# Patient Record
Sex: Female | Born: 1950 | Race: Black or African American | Hispanic: No | Marital: Married | Smoking: Former smoker
Health system: Southern US, Community
[De-identification: ages and names within clinical notes are randomized; demographics above are authoritative.]

## PROBLEM LIST (undated history)

## (undated) DIAGNOSIS — N200 Calculus of kidney: Secondary | ICD-10-CM

## (undated) DIAGNOSIS — I38 Endocarditis, valve unspecified: Secondary | ICD-10-CM

## (undated) DIAGNOSIS — M797 Fibromyalgia: Secondary | ICD-10-CM

## (undated) DIAGNOSIS — I10 Essential (primary) hypertension: Secondary | ICD-10-CM

## (undated) DIAGNOSIS — G4733 Obstructive sleep apnea (adult) (pediatric): Secondary | ICD-10-CM

## (undated) DIAGNOSIS — D860 Sarcoidosis of lung: Secondary | ICD-10-CM

## (undated) DIAGNOSIS — D6800 Von Willebrand disease, unspecified: Secondary | ICD-10-CM

## (undated) DIAGNOSIS — H269 Unspecified cataract: Secondary | ICD-10-CM

## (undated) DIAGNOSIS — H9193 Unspecified hearing loss, bilateral: Secondary | ICD-10-CM

## (undated) DIAGNOSIS — K5792 Diverticulitis of intestine, part unspecified, without perforation or abscess without bleeding: Secondary | ICD-10-CM

## (undated) DIAGNOSIS — Z9889 Other specified postprocedural states: Secondary | ICD-10-CM

## (undated) DIAGNOSIS — H409 Unspecified glaucoma: Secondary | ICD-10-CM

## (undated) DIAGNOSIS — R112 Nausea with vomiting, unspecified: Secondary | ICD-10-CM

## (undated) DIAGNOSIS — K760 Fatty (change of) liver, not elsewhere classified: Secondary | ICD-10-CM

## (undated) DIAGNOSIS — E119 Type 2 diabetes mellitus without complications: Secondary | ICD-10-CM

## (undated) HISTORY — PX: HERNIA REPAIR: SHX51

## (undated) HISTORY — PX: BACK SURGERY: SHX140

## (undated) HISTORY — PX: HAND SURGERY: SHX662

## (undated) HISTORY — PX: LUNG BIOPSY: SHX232

## (undated) HISTORY — PX: TIBIA FRACTURE SURGERY: SHX806

## (undated) HISTORY — PX: APPENDECTOMY: SHX54

## (undated) HISTORY — PX: ABDOMINAL HYSTERECTOMY: SHX81

## (undated) HISTORY — PX: CHOLECYSTECTOMY: SHX55

---

## 2012-09-07 ENCOUNTER — Encounter (HOSPITAL_BASED_OUTPATIENT_CLINIC_OR_DEPARTMENT_OTHER): Payer: Self-pay

## 2012-09-07 ENCOUNTER — Emergency Department (HOSPITAL_BASED_OUTPATIENT_CLINIC_OR_DEPARTMENT_OTHER): Payer: Medicare Other

## 2012-09-07 ENCOUNTER — Emergency Department (HOSPITAL_BASED_OUTPATIENT_CLINIC_OR_DEPARTMENT_OTHER)
Admission: EM | Admit: 2012-09-07 | Discharge: 2012-09-07 | Disposition: A | Discharge status: Home / Self Care | Payer: Medicare Other | Attending: Emergency Medicine | Admitting: Emergency Medicine

## 2012-09-07 DIAGNOSIS — H409 Unspecified glaucoma: Secondary | ICD-10-CM | POA: Insufficient documentation

## 2012-09-07 DIAGNOSIS — Z8739 Personal history of other diseases of the musculoskeletal system and connective tissue: Secondary | ICD-10-CM | POA: Insufficient documentation

## 2012-09-07 DIAGNOSIS — Z87442 Personal history of urinary calculi: Secondary | ICD-10-CM | POA: Insufficient documentation

## 2012-09-07 DIAGNOSIS — Z8709 Personal history of other diseases of the respiratory system: Secondary | ICD-10-CM | POA: Insufficient documentation

## 2012-09-07 DIAGNOSIS — E119 Type 2 diabetes mellitus without complications: Secondary | ICD-10-CM | POA: Insufficient documentation

## 2012-09-07 DIAGNOSIS — Z9089 Acquired absence of other organs: Secondary | ICD-10-CM | POA: Insufficient documentation

## 2012-09-07 DIAGNOSIS — R1032 Left lower quadrant pain: Secondary | ICD-10-CM | POA: Insufficient documentation

## 2012-09-07 DIAGNOSIS — I10 Essential (primary) hypertension: Secondary | ICD-10-CM | POA: Insufficient documentation

## 2012-09-07 DIAGNOSIS — IMO0002 Reserved for concepts with insufficient information to code with codable children: Secondary | ICD-10-CM | POA: Insufficient documentation

## 2012-09-07 DIAGNOSIS — Z8619 Personal history of other infectious and parasitic diseases: Secondary | ICD-10-CM | POA: Insufficient documentation

## 2012-09-07 DIAGNOSIS — Z8719 Personal history of other diseases of the digestive system: Secondary | ICD-10-CM | POA: Insufficient documentation

## 2012-09-07 DIAGNOSIS — Z880 Allergy status to penicillin: Secondary | ICD-10-CM | POA: Insufficient documentation

## 2012-09-07 DIAGNOSIS — Z90710 Acquired absence of both cervix and uterus: Secondary | ICD-10-CM | POA: Insufficient documentation

## 2012-09-07 DIAGNOSIS — Z8679 Personal history of other diseases of the circulatory system: Secondary | ICD-10-CM | POA: Insufficient documentation

## 2012-09-07 DIAGNOSIS — Z872 Personal history of diseases of the skin and subcutaneous tissue: Secondary | ICD-10-CM | POA: Insufficient documentation

## 2012-09-07 DIAGNOSIS — R1110 Vomiting, unspecified: Secondary | ICD-10-CM | POA: Insufficient documentation

## 2012-09-07 DIAGNOSIS — Z79899 Other long term (current) drug therapy: Secondary | ICD-10-CM | POA: Insufficient documentation

## 2012-09-07 DIAGNOSIS — Z8669 Personal history of other diseases of the nervous system and sense organs: Secondary | ICD-10-CM | POA: Insufficient documentation

## 2012-09-07 DIAGNOSIS — R109 Unspecified abdominal pain: Secondary | ICD-10-CM

## 2012-09-07 HISTORY — DX: Fibromyalgia: M79.7

## 2012-09-07 HISTORY — DX: Diverticulitis of intestine, part unspecified, without perforation or abscess without bleeding: K57.92

## 2012-09-07 HISTORY — DX: Unspecified glaucoma: H40.9

## 2012-09-07 HISTORY — DX: Endocarditis, valve unspecified: I38

## 2012-09-07 HISTORY — DX: Type 2 diabetes mellitus without complications: E11.9

## 2012-09-07 HISTORY — DX: Unspecified cataract: H26.9

## 2012-09-07 HISTORY — DX: Essential (primary) hypertension: I10

## 2012-09-07 HISTORY — DX: Obstructive sleep apnea (adult) (pediatric): G47.33

## 2012-09-07 HISTORY — DX: Von Willebrand disease, unspecified: D68.00

## 2012-09-07 HISTORY — DX: Calculus of kidney: N20.0

## 2012-09-07 HISTORY — DX: Sarcoidosis of lung: D86.0

## 2012-09-07 HISTORY — DX: Von Willebrand's disease: D68.0

## 2012-09-07 LAB — CBC WITH DIFFERENTIAL/PLATELET
Basophils Relative: 0 % (ref 0–1)
Hemoglobin: 14 g/dL (ref 12.0–15.0)
Lymphocytes Relative: 13 % (ref 12–46)
MCHC: 33.5 g/dL (ref 30.0–36.0)
Monocytes Relative: 14 % — ABNORMAL HIGH (ref 3–12)
Neutro Abs: 3.4 10*3/uL (ref 1.7–7.7)
Neutrophils Relative %: 72 % (ref 43–77)
RBC: 5.07 MIL/uL (ref 3.87–5.11)
WBC: 4.7 10*3/uL (ref 4.0–10.5)

## 2012-09-07 LAB — COMPREHENSIVE METABOLIC PANEL
ALT: 13 U/L (ref 0–35)
Albumin: 3.4 g/dL — ABNORMAL LOW (ref 3.5–5.2)
Alkaline Phosphatase: 94 U/L (ref 39–117)
Glucose, Bld: 92 mg/dL (ref 70–99)
Potassium: 3.6 mEq/L (ref 3.5–5.1)
Sodium: 141 mEq/L (ref 135–145)
Total Protein: 7.1 g/dL (ref 6.0–8.3)

## 2012-09-07 LAB — URINE MICROSCOPIC-ADD ON

## 2012-09-07 LAB — URINALYSIS, ROUTINE W REFLEX MICROSCOPIC
Hgb urine dipstick: NEGATIVE
Nitrite: NEGATIVE
Specific Gravity, Urine: 1.02 (ref 1.005–1.030)
Urobilinogen, UA: 1 mg/dL (ref 0.0–1.0)

## 2012-09-07 MED ORDER — MORPHINE SULFATE 4 MG/ML IJ SOLN
4.0000 mg | Freq: Once | INTRAMUSCULAR | Status: AC
  Administered 2012-09-07: 4 mg via INTRAVENOUS
  Filled 2012-09-07: qty 1

## 2012-09-07 MED ORDER — ONDANSETRON HCL 4 MG PO TABS: 4.0000 mg | ORAL_TABLET | Freq: Three times a day (TID) | ORAL | Status: DC | PRN

## 2012-09-07 MED ORDER — IOHEXOL 300 MG/ML  SOLN
50.0000 mL | Freq: Once | INTRAMUSCULAR | Status: AC | PRN
  Administered 2012-09-07: 50 mL via ORAL

## 2012-09-07 MED ORDER — ONDANSETRON HCL 4 MG/2ML IJ SOLN
4.0000 mg | Freq: Once | INTRAMUSCULAR | Status: AC
  Administered 2012-09-07: 4 mg via INTRAVENOUS
  Filled 2012-09-07: qty 2

## 2012-09-07 MED ORDER — SODIUM CHLORIDE 0.9 % IV BOLUS (SEPSIS)
1000.0000 mL | Freq: Once | INTRAVENOUS | Status: AC
  Administered 2012-09-07: 1000 mL via INTRAVENOUS

## 2012-09-07 NOTE — ED Provider Notes (Signed)
History     CSN: [id number]  Arrival date & time 09/07/12  1226   First MD Initiated Contact with Patient 09/07/12 1227      Chief Complaint  Patient presents with  . Abdominal Pain    (Consider location/radiation/quality/duration/timing/severity/associated sxs/prior treatment) Patient is a 62 y.o. female presenting with abdominal pain.  Abdominal Pain Associated symptoms include abdominal pain.   Pt with history of multiple medical problems reports moderate to severe aching LLQ abdominal pain off and on for the last 2 days, worse just before BM, one episode of vomiting yesterday. No blood in vomit or stool. No constipation or dysuria. Has history of diverticulosis.   Past Medical History  Diagnosis Date  . Pulmonary sarcoidosis   . Fibromyalgia   . Heart valve disorder   . Glaucoma   . Hypertension   . Von Willebrand disease   . Diabetes mellitus without complication   . Cataracts, bilateral   . OSA (obstructive sleep apnea)   . Diverticulitis   . Kidney stone     Past Surgical History  Procedure Laterality Date  . Cholecystectomy    . Appendectomy    . Abdominal hysterectomy    . Hernia repair    . Tibia fracture surgery    . Hand surgery      No family history on file.  History  Substance Use Topics  . Smoking status: Never Smoker   . Smokeless tobacco: Not on file  . Alcohol Use: No    OB History   Grav Para Term Preterm Abortions TAB SAB Ect Mult Living                  Review of Systems  Gastrointestinal: Positive for abdominal pain.   All other systems reviewed and are negative except as noted in HPI.   Allergies  Cymbalta; Ivp dye; Lyrica; Nickel; and Penicillins  Home Medications   Current Outpatient Rx  Name  Route  Sig  Dispense  Refill  . brimonidine (ALPHAGAN) 0.15 % ophthalmic solution      1 drop 2 (two) times daily.         . brinzolamide (AZOPT) 1 % ophthalmic suspension      1 drop 2 (two) times daily.         .  Cholecalciferol (VITAMIN D PO)   Oral   Take by mouth once a week.         . DULoxetine HCl (CYMBALTA PO)   Oral   Take by mouth.         Marland Kitchen FLUoxetine (PROZAC) 20 MG tablet   Oral   Take 60 mg by mouth daily.         Marland Kitchen OMEPRAZOLE PO   Oral   Take by mouth.         . oxyCODONE (OXYCONTIN) 20 MG 12 hr tablet   Oral   Take 20 mg by mouth every 12 (twelve) hours.         Marland Kitchen PENICILLIN V POTASSIUM PO   Oral   Take by mouth.         . predniSONE (DELTASONE) 2.5 MG tablet   Oral   Take 2.5 mg by mouth every other day.         . Pregabalin (LYRICA PO)   Oral   Take by mouth.         Marland Kitchen tiZANidine (ZANAFLEX) 2 MG tablet   Oral   Take 2 mg by mouth every  6 (six) hours as needed.         . verapamil (COVERA HS) 180 MG (CO) 24 hr tablet   Oral   Take 180 mg by mouth at bedtime.         Marland Kitchen zolpidem (AMBIEN) 5 MG tablet   Oral   Take 2.5 mg by mouth at bedtime as needed for sleep.           There were no vitals taken for this visit.  Physical Exam  Nursing note and vitals reviewed. Constitutional: She is oriented to person, place, and time. She appears well-developed and well-nourished.  HENT:  Head: Normocephalic and atraumatic.  Eyes: EOM are normal. Pupils are equal, round, and reactive to light.  Neck: Normal range of motion. Neck supple.  Cardiovascular: Normal rate, normal heart sounds and intact distal pulses.   Pulmonary/Chest: Effort normal and breath sounds normal.  Abdominal: Bowel sounds are normal. She exhibits no distension. There is tenderness (LLQ). There is no rebound and no guarding.  Musculoskeletal: Normal range of motion. She exhibits no edema and no tenderness.  Neurological: She is alert and oriented to person, place, and time. She has normal strength. No cranial nerve deficit or sensory deficit.  Skin: Skin is warm and dry. No rash noted.  Psychiatric: She has a normal mood and affect.    ED Course  Procedures (including  critical care time)  Labs Reviewed  CBC WITH DIFFERENTIAL - Abnormal; Notable for the following:    Lymphs Abs 0.6 (*)    Monocytes Relative 14 (*)    All other components within normal limits  URINALYSIS, ROUTINE W REFLEX MICROSCOPIC - Abnormal; Notable for the following:    Leukocytes, UA SMALL (*)    All other components within normal limits  COMPREHENSIVE METABOLIC PANEL - Abnormal; Notable for the following:    Albumin 3.4 (*)    GFR calc non Af Amer 77 (*)    GFR calc Af Amer 89 (*)    All other components within normal limits  URINE MICROSCOPIC-ADD ON - Abnormal; Notable for the following:    Casts HYALINE CASTS (*)    All other components within normal limits   Ct Abdomen Pelvis Wo Contrast  09/07/2012   *RADIOLOGY REPORT*  Clinical Data: Left lower quadrant pain.  History of diverticulitis.  CT ABDOMEN AND PELVIS WITHOUT CONTRAST  Technique:  Multidetector CT imaging of the abdomen and pelvis was performed following the standard protocol without intravenous contrast.  Comparison: None.  Findings: There is marked elevation of the right hemidiaphragm relative to the left.  Lung bases are clear.  No pleural or pericardial effusion.  Calcific coronary artery disease is identified.  The patient is status post cholecystectomy.  The liver, spleen, adrenal glands and pancreas appear normal.  The kidneys are unremarkable.  The patient is status post hysterectomy. The patient has extensive diverticulosis but no evidence of diverticulitis is seen. Several small lymph nodes are identified in the upper retroperitoneum measuring up to 1.1 cm in diameter.  There is no fluid collection. No focal bony abnormality.  IMPRESSION:  1.  No acute finding. 2.  Extensive diverticulosis without evidence of diverticulitis. 3.  Status post cholecystectomy. 4.  Small upper retroperitoneal lymph nodes cannot be definitively characterized.  They are likely reactive but repeat CT abdomen and pelvis with contrast in 4-6  months to ensure stability or resolution is recommended.   Original Report Authenticated By: Holley Dexter, M.D.     No  diagnosis found.    MDM  Labs and imaging unremarkable. No definite cause of abdominal pain. Pt's abdomen remains benign. Advised to take home pain meds if needed. Nausea meds PRN and follow up with PCP if not improving.         Lucee Brissett B. Bernette Mayers, MD 09/07/12 1530

## 2012-09-07 NOTE — ED Notes (Signed)
MD at bedside. 

## 2012-09-07 NOTE — ED Notes (Signed)
C/o LLQ pain today RLQ pain x 2 days

## 2012-09-07 NOTE — ED Notes (Signed)
Patient ambulates to restroom without difficulty and with no assistance.

## 2013-01-08 ENCOUNTER — Encounter (HOSPITAL_COMMUNITY): Payer: Self-pay | Admitting: Pharmacy Technician

## 2013-01-14 ENCOUNTER — Ambulatory Visit (HOSPITAL_COMMUNITY): Admission: RE | Admit: 2013-01-14 | Payer: Medicare Other | Source: Ambulatory Visit | Admitting: Oral Surgery

## 2013-01-14 ENCOUNTER — Encounter (HOSPITAL_COMMUNITY): Admission: RE | Payer: Self-pay | Source: Ambulatory Visit

## 2013-01-14 SURGERY — DENTAL RESTORATION/EXTRACTIONS
Anesthesia: General | Site: Mouth

## 2013-02-08 ENCOUNTER — Encounter (HOSPITAL_COMMUNITY): Payer: Self-pay | Admitting: Pharmacy Technician

## 2013-02-08 NOTE — Consult Note (Signed)
  This is a 62 y/o female who presents with Sarcoidosis, type II DM, Fibromyalgia, and hypertension.  The treatment plan is to remove the non restorable teeth under general anesthesia.  The patient is nervous and not a good medical candidate for sedation in the office

## 2013-02-12 ENCOUNTER — Encounter (HOSPITAL_COMMUNITY)
Admission: RE | Admit: 2013-02-12 | Discharge: 2013-02-12 | Disposition: A | Discharge status: Home / Self Care | Payer: Medicare Other | Source: Ambulatory Visit | Attending: Oral Surgery | Admitting: Oral Surgery

## 2013-02-12 ENCOUNTER — Encounter (HOSPITAL_COMMUNITY): Payer: Self-pay

## 2013-02-12 ENCOUNTER — Encounter (HOSPITAL_COMMUNITY): Payer: Self-pay | Admitting: Oral Surgery

## 2013-02-12 ENCOUNTER — Ambulatory Visit (HOSPITAL_COMMUNITY)
Admission: RE | Admit: 2013-02-12 | Discharge: 2013-02-12 | Disposition: A | Discharge status: Home / Self Care | Payer: Medicare Other | Source: Ambulatory Visit | Attending: Anesthesiology | Admitting: Anesthesiology

## 2013-02-12 DIAGNOSIS — Z01818 Encounter for other preprocedural examination: Secondary | ICD-10-CM | POA: Insufficient documentation

## 2013-02-12 DIAGNOSIS — Z01811 Encounter for preprocedural respiratory examination: Secondary | ICD-10-CM | POA: Insufficient documentation

## 2013-02-12 DIAGNOSIS — Z01812 Encounter for preprocedural laboratory examination: Secondary | ICD-10-CM | POA: Insufficient documentation

## 2013-02-12 DIAGNOSIS — Z01810 Encounter for preprocedural cardiovascular examination: Secondary | ICD-10-CM | POA: Insufficient documentation

## 2013-02-12 HISTORY — DX: Nausea with vomiting, unspecified: R11.2

## 2013-02-12 HISTORY — DX: Other specified postprocedural states: Z98.890

## 2013-02-12 LAB — COMPREHENSIVE METABOLIC PANEL
ALT: 12 U/L (ref 0–35)
Albumin: 3.4 g/dL — ABNORMAL LOW (ref 3.5–5.2)
Alkaline Phosphatase: 86 U/L (ref 39–117)
Potassium: 3.8 mEq/L (ref 3.5–5.1)
Sodium: 140 mEq/L (ref 135–145)
Total Protein: 7.1 g/dL (ref 6.0–8.3)

## 2013-02-12 LAB — CBC
HCT: 41.7 % (ref 36.0–46.0)
Hemoglobin: 13.6 g/dL (ref 12.0–15.0)
MCHC: 32.6 g/dL (ref 30.0–36.0)
Platelets: 175 10*3/uL (ref 150–400)
RDW: 14 % (ref 11.5–15.5)
WBC: 4.3 10*3/uL (ref 4.0–10.5)

## 2013-02-12 LAB — NO BLOOD PRODUCTS

## 2013-02-12 NOTE — Progress Notes (Signed)
Requested records from [facility] (last OV, EKG, Stress (if ava), Echo (if ava), [facility] (last OV), and [facility] (last OV)

## 2013-02-12 NOTE — Progress Notes (Signed)
Spoke to office and requested orders be placed in Epic

## 2013-02-12 NOTE — Pre-Procedure Instructions (Signed)
Deanna Allison  02/12/2013   Your procedure is scheduled on:  November 13  Report to [facility] Entrance "A" [address] at [employer] AM.  Call this number if you have problems the morning of surgery: [phone]   Remember:   Do not eat food or drink liquids after midnight.   Take these medicines the morning of surgery with A SIP OF WATER: Eye drops, Omeprazole, Oxycodone (if needed), Prednisone, Verapamil, Fluoxetine,     STOP Aspirin, Aleve, Naproxen, Advil, Ibuprofen, Vitamin, Herbs, or Supplements starting today   Do not wear jewelry, make-up or nail polish.  Do not wear lotions, powders, or perfumes. You may wear deodorant.  Do not shave 48 hours prior to surgery. Men may shave face and neck.  Do not bring valuables to the hospital.  [facility] is not responsible                  for any belongings or valuables.               Contacts, dentures or bridgework may not be worn into surgery.  Leave suitcase in the car. After surgery it may be brought to your room.  For patients admitted to the hospital, discharge time is determined by your                treatment team.               Patients discharged the day of surgery will not be allowed to drive  home.  Name and phone number of your driver: Family/ Friend  Special Instructions: Shower using CHG 2 nights before surgery and the night before surgery.  If you shower the day of surgery use CHG.  Use special wash - you have one bottle of CHG for all showers.  You should use approximately 1/3 of the bottle for each shower.   Please read over the following fact sheets that you were given: Pain Booklet, Coughing and Deep Breathing and Surgical Site Infection Prevention

## 2013-02-15 ENCOUNTER — Encounter (HOSPITAL_COMMUNITY): Payer: Self-pay

## 2013-02-15 NOTE — Progress Notes (Addendum)
Anesthesia Chart Review:  Patient is a 62 year old female scheduled for dental extractions by Dr. Hyacinth Meeker on 02/18/13.  History includes former smoker, obesity, post-operative N/V, pulmonary Sarcoidosis, Von Willebrands disease, fibromyalgia, DM2, HTN, OSA on autoPAP, diverticulitis, glaucoma, cataracts, nephrolithiasis, heart valve disorder (see echo below), Jehovah's Witness.  Hematologist is Dr. Silvestre Moment who saw her in July 2014.  Depending on her labs, he was going to decide about clearance recommendations.  I have spoken with Dr. Hyacinth Meeker and staff at Dr. Erick Colace office.  Dr. Gypsy Lore will review 02/12/2013 labs and then decide if additional labs are needed prior to surgery.  I've ask that he fax any additional labs done and provide clearance recommendations as felt appropriate.  Pulmonologist is Dr. Dr. Kenney Houseman with [facility], last visit 02/04/13.  His notes indicate that he was aware of plans for dental surgery.    Cardiologist is Dr. Rhona Leavens with [facility], last visit 08/31/12 with echo and yearly follow-up recommended.  Echo on 09/22/12 showed normal LV cavity size, mild concentric LVH, normal wall motion and systolic function, visual EF 60-65%, severely dilated LA cavity, mildly dilated RA, mild aortic annulus calcification with trace AR, no AS, mild calcification of the mitral annulus with mild to moderate MR, no MS, normal diastolic pattern, mild to moderate TR, mild pulmonary hypertension, normal diastolic flow pattern, mild pulmonic regurgitation, normal aortic foot.  Nuclear stress test on 03/13/10 showed no evidence of ischemia, LVEF 72%.  EKG on 02/12/13 showed NSR with occasional PACs, non-specific T wave abnormality.   CXR on 02/12/13 showed: Elevated right hemidiaphragm. No edema or consolidation. Soft tissue fullness in the aortopulmonary window region. This finding could indicate a degree of adenopathy. Given this finding and the  history  of sarcoidosis, correlation with contrast enhanced chest CT may be advisable to further assess.  Report faxed to Dr. Ysidro Evert with confirmation.  Preoperative labs noted.  CBC WNL. PT/PTT were not done, so I will order them for the day of surgery since she has a known bleeding disorder. She has requested that no blood products be transfused.  I'll follow-up once I receive additional input from hematology.    Velna Ochs [facility] Short Stay Center/Anesthesiology Phone [phone] 02/15/2013 2:40 PM  Addendum: 02/17/2013 1:50 PM Hematology clearance re-requested earlier this morning and has not been received; however, it's now noted that surgery has now been rescheduled for 03/18/2013.

## 2013-02-16 NOTE — Progress Notes (Signed)
Spoke with [facility] and they will refer message to Dr Gypsy Lore re: need for hematology clearance.  Phone number for surgical short stay and Shonna Chock given to office and also surgical short stay fax number.

## 2013-03-11 ENCOUNTER — Encounter (HOSPITAL_COMMUNITY)
Admission: RE | Admit: 2013-03-11 | Discharge: 2013-03-11 | Disposition: A | Discharge status: Home / Self Care | Payer: Medicare Other | Source: Ambulatory Visit | Attending: Oral Surgery | Admitting: Oral Surgery

## 2013-03-11 ENCOUNTER — Encounter (HOSPITAL_COMMUNITY): Payer: Self-pay

## 2013-03-11 DIAGNOSIS — Z01818 Encounter for other preprocedural examination: Secondary | ICD-10-CM | POA: Insufficient documentation

## 2013-03-11 DIAGNOSIS — Z01812 Encounter for preprocedural laboratory examination: Secondary | ICD-10-CM | POA: Insufficient documentation

## 2013-03-11 HISTORY — DX: Unspecified hearing loss, bilateral: H91.93

## 2013-03-11 LAB — CBC
HCT: 41.8 % (ref 36.0–46.0)
Hemoglobin: 13.9 g/dL (ref 12.0–15.0)
MCH: 27.8 pg (ref 26.0–34.0)
MCHC: 33.3 g/dL (ref 30.0–36.0)
Platelets: 184 10*3/uL (ref 150–400)

## 2013-03-11 LAB — BASIC METABOLIC PANEL
BUN: 17 mg/dL (ref 6–23)
Calcium: 9.2 mg/dL (ref 8.4–10.5)
Creatinine, Ser: 0.85 mg/dL (ref 0.50–1.10)
GFR calc non Af Amer: 71 mL/min — ABNORMAL LOW (ref >90)
Glucose, Bld: 107 mg/dL — ABNORMAL HIGH (ref 70–99)
Potassium: 4 mEq/L (ref 3.5–5.1)

## 2013-03-11 LAB — APTT: aPTT: 30 seconds (ref 24–37)

## 2013-03-11 NOTE — Consult Note (Signed)
  This is a 62 y/o female who presents with multiple non restorable teeth. Her medical history includes Von Willabrands disease, sleep apnea and hypertension.  The treatment plan includes extractions and insertion of an immediate denture.

## 2013-03-11 NOTE — Progress Notes (Addendum)
Per pt labs drawn 12-3 at [facility].  Spoke with Vernona Rieger in Dr Gypsy Lore office 12-4 @ 1245. Labs pending, clearance pending lab results  Dr Gypsy Lore office [phone]

## 2013-03-11 NOTE — H&P (Signed)
Deanna Allison is an 62 y.o. female.   Chief Complaint: painful teeth  AVW:UJWJ standing dental problems, Von Willabrands disease  Past Medical History  Diagnosis Date  . Pulmonary sarcoidosis   . Fibromyalgia   . Heart valve disorder     trace AR, mild PR, mild to mod MR/TR, EF 60% 09/22/12 echo ([facility])  . Glaucoma   . Von Willebrand disease   . Diabetes mellitus without complication   . Cataracts, bilateral   . Diverticulitis   . Kidney stone   . PONV (postoperative nausea and vomiting)   . Hypertension     See [facility] at [facility]  . OSA (obstructive sleep apnea)     uses CPAP sleep study > 3 years ago  . Hearing loss of both ears     Past Surgical History  Procedure Laterality Date  . Cholecystectomy    . Appendectomy    . Abdominal hysterectomy    . Hernia repair    . Tibia fracture surgery    . Hand surgery    . Lung biopsy      No family history on file. Social History:  reports that she quit smoking about 36 years ago. She does not have any smokeless tobacco history on file. She reports that she uses illicit drugs (Marijuana). She reports that she does not drink alcohol.  Allergies:  Allergies  Allergen Reactions  . Cymbalta [Duloxetine Hcl] Nausea And Vomiting  . Iodine Nausea And Vomiting  . Ivp Dye [Iodinated Diagnostic Agents]   . Lyrica [Pregabalin]     Depression  . Nsaids     Sits in my stomach  . Penicillins Hives  . Nickel Rash    No prescriptions prior to admission    Results for orders placed during the hospital encounter of 03/11/13 (from the past 48 hour(s))  APTT     Status: None   Collection Time    03/11/13 12:22 PM      Result Value Range   aPTT 30  24 - 37 seconds  PROTIME-INR     Status: None   Collection Time    03/11/13 12:22 PM      Result Value Range   Prothrombin Time 13.8  11.6 - 15.2 seconds   INR 1.08  0.00 - 1.49  BASIC METABOLIC PANEL     Status: Abnormal   Collection Time    03/11/13  12:22 PM      Result Value Range   Sodium 138  135 - 145 mEq/L   Potassium 4.0  3.5 - 5.1 mEq/L   Chloride 101  96 - 112 mEq/L   CO2 29  19 - 32 mEq/L   Glucose, Bld 107 (*) 70 - 99 mg/dL   BUN 17  6 - 23 mg/dL   Creatinine, Ser 1.91  0.50 - 1.10 mg/dL   Calcium 9.2  8.4 - 47.8 mg/dL   GFR calc non Af Amer 71 (*) >90 mL/min   GFR calc Af Amer 82 (*) >90 mL/min   Comment: (NOTE)     The eGFR has been calculated using the CKD EPI equation.     This calculation has not been validated in all clinical situations.     eGFR's persistently <90 mL/min signify possible Chronic Kidney     Disease.  CBC     Status: Abnormal   Collection Time    03/11/13 12:22 PM      Result Value Range   WBC 3.9 (*)  4.0 - 10.5 K/uL   RBC 5.00  3.87 - 5.11 MIL/uL   Hemoglobin 13.9  12.0 - 15.0 g/dL   HCT 16.1  09.6 - 04.5 %   MCV 83.6  78.0 - 100.0 fL   MCH 27.8  26.0 - 34.0 pg   MCHC 33.3  30.0 - 36.0 g/dL   RDW 40.9  81.1 - 91.4 %   Platelets 184  150 - 400 K/uL   No results found.  ROS  There were no vitals taken for this visit. Physical Exam  HENT:  Mouth/Throat: Dental caries present.       Assessment/Plan Removal of all decayed and nonrestorable teeth  Bryler Dibble,JOSEPH L 03/11/2013, 4:55 PM

## 2013-03-11 NOTE — Progress Notes (Signed)
No presurgical orders, Vikki Ports notified

## 2013-03-11 NOTE — Pre-Procedure Instructions (Signed)
Francyne Arreaga Bishop-McKenzie  03/11/2013   Your procedure is scheduled on: March 18, 2013  Report to [facility] (Entrance A) at 5:30 AM.  Call this number if you have problems the morning of surgery: [phone]   Remember:   Do not eat food or drink liquids after midnight.   Take these medicines the morning of surgery with A SIP OF WATER: brimonidine (ALPHAGAN) 0.15 % ophthalmic, brinzolamide (AZOPT) 1 % ophthalmic,  FLUoxetine, omeprazole (PRILOSEC),  oxycodone (ROXICODONE as needed for pain, tiZANidine (ZANAFLEX) as needed for muscle spasms   Do not wear jewelry, make-up or nail polish.  Do not wear lotions, powders, or perfumes. You may wear deodorant.  Do not shave 48 hours prior to surgery. Men may shave face and neck.  Do not bring valuables to the hospital.  [facility] is not responsible  for any belongings or valuables.               Contacts, dentures or bridgework may not be worn into surgery.  Leave suitcase in the car. After surgery it may be brought to your room.  For patients admitted to the hospital, discharge time is determined by your treatment team.               Patients discharged the day of surgery will not be allowed to drive home.  Name and phone number of your driver:   Special Instructions: Shower using CHG 2 nights before surgery and the night before surgery.  If you shower the day of surgery use CHG.  Use special wash - you have one bottle of CHG for all showers.  You should use approximately 1/3 of the bottle for each shower.   Please read over the following fact sheets that you were given: Pain Booklet, Coughing and Deep Breathing and Surgical Site Infection Prevention

## 2013-03-17 NOTE — Progress Notes (Signed)
Anesthesia follow-up:  Please see my note from 02/15/13.  Surgery has now been scheduled for 03/18/13.  Dr. Hyacinth Meeker did get medical clearance from Dr. Bernadette Hoit.  I also spoke with Dr. Hyacinth Meeker and patient today.  Patient's hematologist Dr. Gypsy Lore actually called and spoke with Dr. Hyacinth Meeker this afternoon.  Patient also heard from Dr. Erick Colace office.  He has requested that she receive 0.3 mcg/kg DDAVP IV one hour before surgery. Patient does not have nasal DDAVP, so it will need to be given IV or [place] once in Holding.  Standard dose in Epic is 0.4 mcg/kg, so Dr. Hyacinth Meeker called and gave verbal order to RN for DDAVP 0.4 mcg/kg IV to be given one hour prior to procedure.      Velna Ochs [facility] Short Stay Center/Anesthesiology Phone [phone] 03/17/2013 5:56 PM

## 2013-03-18 ENCOUNTER — Encounter (HOSPITAL_COMMUNITY): Payer: Medicare Other | Admitting: Vascular Surgery

## 2013-03-18 ENCOUNTER — Encounter (HOSPITAL_COMMUNITY)
Admission: RE | Disposition: A | Discharge status: Home / Self Care | Payer: Self-pay | Source: Ambulatory Visit | Attending: Oral Surgery

## 2013-03-18 ENCOUNTER — Ambulatory Visit (HOSPITAL_COMMUNITY): Payer: Medicare Other | Admitting: Anesthesiology

## 2013-03-18 ENCOUNTER — Ambulatory Visit (HOSPITAL_COMMUNITY)
Admission: RE | Admit: 2013-03-18 | Discharge: 2013-03-18 | Disposition: A | Discharge status: Home / Self Care | Payer: Medicare Other | Source: Ambulatory Visit | Attending: Oral Surgery | Admitting: Oral Surgery

## 2013-03-18 ENCOUNTER — Encounter (HOSPITAL_COMMUNITY): Payer: Self-pay | Admitting: Anesthesiology

## 2013-03-18 DIAGNOSIS — Z87891 Personal history of nicotine dependence: Secondary | ICD-10-CM | POA: Diagnosis not present

## 2013-03-18 DIAGNOSIS — H269 Unspecified cataract: Secondary | ICD-10-CM | POA: Insufficient documentation

## 2013-03-18 DIAGNOSIS — G4733 Obstructive sleep apnea (adult) (pediatric): Secondary | ICD-10-CM | POA: Diagnosis not present

## 2013-03-18 DIAGNOSIS — D6800 Von Willebrand disease, unspecified: Secondary | ICD-10-CM | POA: Insufficient documentation

## 2013-03-18 DIAGNOSIS — F1910 Other psychoactive substance abuse, uncomplicated: Secondary | ICD-10-CM | POA: Diagnosis not present

## 2013-03-18 DIAGNOSIS — D869 Sarcoidosis, unspecified: Secondary | ICD-10-CM | POA: Insufficient documentation

## 2013-03-18 DIAGNOSIS — IMO0001 Reserved for inherently not codable concepts without codable children: Secondary | ICD-10-CM | POA: Diagnosis not present

## 2013-03-18 DIAGNOSIS — F1210 Cannabis abuse, uncomplicated: Secondary | ICD-10-CM | POA: Diagnosis not present

## 2013-03-18 DIAGNOSIS — H9190 Unspecified hearing loss, unspecified ear: Secondary | ICD-10-CM | POA: Insufficient documentation

## 2013-03-18 DIAGNOSIS — K5730 Diverticulosis of large intestine without perforation or abscess without bleeding: Secondary | ICD-10-CM | POA: Diagnosis not present

## 2013-03-18 DIAGNOSIS — K029 Dental caries, unspecified: Secondary | ICD-10-CM | POA: Insufficient documentation

## 2013-03-18 DIAGNOSIS — I10 Essential (primary) hypertension: Secondary | ICD-10-CM | POA: Insufficient documentation

## 2013-03-18 DIAGNOSIS — N200 Calculus of kidney: Secondary | ICD-10-CM | POA: Insufficient documentation

## 2013-03-18 DIAGNOSIS — E119 Type 2 diabetes mellitus without complications: Secondary | ICD-10-CM | POA: Insufficient documentation

## 2013-03-18 DIAGNOSIS — H409 Unspecified glaucoma: Secondary | ICD-10-CM | POA: Diagnosis not present

## 2013-03-18 HISTORY — PX: MULTIPLE EXTRACTIONS WITH ALVEOLOPLASTY: SHX5342

## 2013-03-18 LAB — GLUCOSE, CAPILLARY: Glucose-Capillary: 175 mg/dL — ABNORMAL HIGH (ref 70–99)

## 2013-03-18 SURGERY — MULTIPLE EXTRACTION WITH ALVEOLOPLASTY
Anesthesia: General | Site: Mouth

## 2013-03-18 MED ORDER — HYDROCODONE-ACETAMINOPHEN 7.5-325 MG/15ML PO SOLN: 10.0000 mL | ORAL | Status: DC | PRN

## 2013-03-18 MED ORDER — LIDOCAINE-EPINEPHRINE 2 %-1:100000 IJ SOLN
INTRAMUSCULAR | Status: AC
  Filled 2013-03-18: qty 11.9

## 2013-03-18 MED ORDER — MIDAZOLAM HCL 5 MG/5ML IJ SOLN
INTRAMUSCULAR | Status: DC | PRN
  Administered 2013-03-18: 1 mg via INTRAVENOUS

## 2013-03-18 MED ORDER — OXYMETAZOLINE HCL 0.05 % NA SOLN
NASAL | Status: AC
  Filled 2013-03-18: qty 15

## 2013-03-18 MED ORDER — 0.9 % SODIUM CHLORIDE (POUR BTL) OPTIME
TOPICAL | Status: DC | PRN
  Administered 2013-03-18: 1000 mL

## 2013-03-18 MED ORDER — DOUBLE ANTIBIOTIC 500-10000 UNIT/GM EX OINT
TOPICAL_OINTMENT | CUTANEOUS | Status: AC
  Filled 2013-03-18: qty 1

## 2013-03-18 MED ORDER — HYDROMORPHONE HCL PF 1 MG/ML IJ SOLN
0.2500 mg | INTRAMUSCULAR | Status: DC | PRN
  Administered 2013-03-18: 0.5 mg via INTRAVENOUS

## 2013-03-18 MED ORDER — LACTATED RINGERS IV SOLN
INTRAVENOUS | Status: DC | PRN
  Administered 2013-03-18 (×2): via INTRAVENOUS

## 2013-03-18 MED ORDER — FENTANYL CITRATE 0.05 MG/ML IJ SOLN
INTRAMUSCULAR | Status: DC | PRN
  Administered 2013-03-18 (×3): 50 ug via INTRAVENOUS

## 2013-03-18 MED ORDER — OXYCODONE HCL 5 MG PO TABS
ORAL_TABLET | ORAL | Status: AC
  Filled 2013-03-18: qty 2

## 2013-03-18 MED ORDER — BACIT-POLY-NEO HC 1 % EX OINT
TOPICAL_OINTMENT | CUTANEOUS | Status: AC
  Filled 2013-03-18: qty 15

## 2013-03-18 MED ORDER — ONDANSETRON HCL 4 MG/2ML IJ SOLN: 4.0000 mg | Freq: Once | INTRAMUSCULAR | Status: DC | PRN

## 2013-03-18 MED ORDER — PROPOFOL 10 MG/ML IV BOLUS
INTRAVENOUS | Status: DC | PRN
  Administered 2013-03-18: 250 mg via INTRAVENOUS

## 2013-03-18 MED ORDER — LIDOCAINE-EPINEPHRINE 2 %-1:100000 IJ SOLN
INTRAMUSCULAR | Status: DC | PRN
  Administered 2013-03-18: 6.8 mL

## 2013-03-18 MED ORDER — DESMOPRESSIN ACETATE 4 MCG/ML IJ SOLN
0.4000 ug/kg | Freq: Once | INTRAMUSCULAR | Status: AC
  Administered 2013-03-18: 36.4 ug via INTRAVENOUS
  Filled 2013-03-18: qty 9.1

## 2013-03-18 MED ORDER — HEMOSTATIC AGENTS (NO CHARGE) OPTIME
TOPICAL | Status: DC | PRN
  Administered 2013-03-18: 1 via TOPICAL

## 2013-03-18 MED ORDER — SODIUM CHLORIDE 0.9 % IR SOLN
Status: DC | PRN
  Administered 2013-03-18: 1000 mL

## 2013-03-18 MED ORDER — HYDROMORPHONE HCL PF 1 MG/ML IJ SOLN
INTRAMUSCULAR | Status: AC
  Filled 2013-03-18: qty 1

## 2013-03-18 MED ORDER — CLINDAMYCIN PHOSPHATE 600 MG/50ML IV SOLN
600.0000 mg | INTRAVENOUS | Status: AC
  Administered 2013-03-18: 600 mg via INTRAVENOUS
  Filled 2013-03-18: qty 50

## 2013-03-18 MED ORDER — ROCURONIUM BROMIDE 100 MG/10ML IV SOLN
INTRAVENOUS | Status: DC | PRN
  Administered 2013-03-18: 20 mg via INTRAVENOUS

## 2013-03-18 MED ORDER — LIDOCAINE HCL (CARDIAC) 20 MG/ML IV SOLN
INTRAVENOUS | Status: DC | PRN
  Administered 2013-03-18: 100 mg via INTRAVENOUS

## 2013-03-18 MED ORDER — ONDANSETRON HCL 4 MG/2ML IJ SOLN
INTRAMUSCULAR | Status: DC | PRN
  Administered 2013-03-18: 4 mg via INTRAVENOUS

## 2013-03-18 MED ORDER — GLYCOPYRROLATE 0.2 MG/ML IJ SOLN
INTRAMUSCULAR | Status: DC | PRN
  Administered 2013-03-18: .7 mg via INTRAVENOUS

## 2013-03-18 MED ORDER — SUCCINYLCHOLINE CHLORIDE 20 MG/ML IJ SOLN
INTRAMUSCULAR | Status: DC | PRN
  Administered 2013-03-18: 50 mg via INTRAVENOUS

## 2013-03-18 MED ORDER — ARTIFICIAL TEARS OP OINT
TOPICAL_OINTMENT | OPHTHALMIC | Status: DC | PRN
  Administered 2013-03-18: 1 via OPHTHALMIC

## 2013-03-18 MED ORDER — OXYCODONE HCL 5 MG PO TABS
10.0000 mg | ORAL_TABLET | Freq: Once | ORAL | Status: AC
  Administered 2013-03-18: 10 mg via ORAL

## 2013-03-18 MED ORDER — NEOSTIGMINE METHYLSULFATE 1 MG/ML IJ SOLN
INTRAMUSCULAR | Status: DC | PRN
  Administered 2013-03-18: 3.5 mg via INTRAVENOUS

## 2013-03-18 SURGICAL SUPPLY — 33 items
ALCOHOL 70% 16 OZ (MISCELLANEOUS) ×2 IMPLANT
BLADE SURG 15 STRL LF DISP TIS (BLADE) ×1 IMPLANT
BLADE SURG 15 STRL SS (BLADE) ×1
BUR RND FLUTED 2.5 (BURR) IMPLANT
BUR STRYKR 2.5 FLUT MED (BURR) IMPLANT
BUR SURG 4X8 MED (BURR) IMPLANT
BURR SURG 4X8 MED (BURR)
CANISTER SUCTION 2500CC (MISCELLANEOUS) ×2 IMPLANT
CLOTH BEACON ORANGE TIMEOUT ST (SAFETY) IMPLANT
COVER SURGICAL LIGHT HANDLE (MISCELLANEOUS) ×2 IMPLANT
GAUZE PACKING FOLDED 2  STR (GAUZE/BANDAGES/DRESSINGS) ×1
GAUZE PACKING FOLDED 2 STR (GAUZE/BANDAGES/DRESSINGS) ×1 IMPLANT
GAUZE SPONGE 4X4 16PLY XRAY LF (GAUZE/BANDAGES/DRESSINGS) ×2 IMPLANT
GLOVE BIO SURGEON STRL SZ 6.5 (GLOVE) ×4 IMPLANT
GLOVE BIO SURGEON STRL SZ7.5 (GLOVE) ×2 IMPLANT
GOWN PREVENTION PLUS XLARGE (GOWN DISPOSABLE) ×2 IMPLANT
GOWN STRL NON-REIN LRG LVL3 (GOWN DISPOSABLE) ×4 IMPLANT
KIT BASIN OR (CUSTOM PROCEDURE TRAY) ×2 IMPLANT
KIT ROOM TURNOVER OR (KITS) ×2 IMPLANT
NEEDLE BLUNT 16X1.5 OR ONLY (NEEDLE) ×4 IMPLANT
NEEDLE DENTAL 27 LONG (NEEDLE) ×4 IMPLANT
NS IRRIG 1000ML POUR BTL (IV SOLUTION) ×2 IMPLANT
PACK EENT II TURBAN DRAPE (CUSTOM PROCEDURE TRAY) ×2 IMPLANT
PAD ARMBOARD 7.5X6 YLW CONV (MISCELLANEOUS) ×4 IMPLANT
SPONGE GAUZE 4X4 12PLY (GAUZE/BANDAGES/DRESSINGS) IMPLANT
SPONGE SURGIFOAM ABS GEL 12-7 (HEMOSTASIS) ×2 IMPLANT
SUT CHROMIC 3 0 PS 2 (SUTURE) ×6 IMPLANT
SYR 50ML SLIP (SYRINGE) ×4 IMPLANT
TUBE CONNECTING 12X1/4 (SUCTIONS) ×2 IMPLANT
TUBING IRRIGATION (MISCELLANEOUS) IMPLANT
VENT IRR SPI W TUB AD (MISCELLANEOUS) ×2 IMPLANT
WATER STERILE IRR 1000ML POUR (IV SOLUTION) IMPLANT
YANKAUER SUCT BULB TIP NO VENT (SUCTIONS) ×2 IMPLANT

## 2013-03-18 NOTE — Anesthesia Procedure Notes (Signed)
Procedure Name: Intubation Date/Time: 03/18/2013 7:47 AM Performed by: Carmela Rima Pre-anesthesia Checklist: Patient identified, Emergency Drugs available, Suction available, Patient being monitored and Timeout performed Patient Re-evaluated:Patient Re-evaluated prior to inductionOxygen Delivery Method: Circle system utilized Preoxygenation: Pre-oxygenation with 100% oxygen Intubation Type: IV induction Ventilation: Mask ventilation without difficulty Laryngoscope Size: Mac and 3 Grade View: Grade I Nasal Tubes: Right, Nasal prep performed, Nasal Rae and Magill forceps- large, utilized Tube size: 6.5 mm Placement Confirmation: ETT inserted through vocal cords under direct vision,  positive ETCO2 and breath sounds checked- equal and bilateral Tube secured with: Tape Dental Injury: Bloody posterior oropharynx

## 2013-03-18 NOTE — Anesthesia Preprocedure Evaluation (Addendum)
Anesthesia Evaluation  Patient identified by MRN, date of birth, ID band Patient awake    Reviewed: Allergy & Precautions, H&P , NPO status , Patient's Chart, lab work & pertinent test results  History of Anesthesia Complications (+) PONV and history of anesthetic complications  Airway Mallampati: II      Dental  (+) Poor Dentition and Dental Advidsory Given   Pulmonary sleep apnea , former smoker,          Cardiovascular hypertension,     Neuro/Psych  Neuromuscular disease    GI/Hepatic (+)     substance abuse  marijuana use,   Endo/Other  diabetes, Type 2  Renal/GU Renal disease     Musculoskeletal   Abdominal   Peds  Hematology   Anesthesia Other Findings Von Willabran  Reproductive/Obstetrics                          Anesthesia Physical Anesthesia Plan  ASA: III  Anesthesia Plan: General   Post-op Pain Management:    Induction: Intravenous  Airway Management Planned: Nasal ETT  Additional Equipment:   Intra-op Plan:   Post-operative Plan: Extubation in OR  Informed Consent: I have reviewed the patients History and Physical, chart, labs and discussed the procedure including the risks, benefits and alternatives for the proposed anesthesia with the patient or authorized representative who has indicated his/her understanding and acceptance.   Dental Advisory Given  Plan Discussed with: Anesthesiologist, CRNA and Surgeon  Anesthesia Plan Comments:        Anesthesia Quick Evaluation

## 2013-03-18 NOTE — Anesthesia Postprocedure Evaluation (Signed)
  Anesthesia Post-op Note  Patient: Deanna Allison  Procedure(s) Performed: Procedure(s): MULTIPLE SURGICAL TEETH EXTRACTIONS (N/A)  Patient Location: PACU  Anesthesia Type:General  Level of Consciousness: awake, alert , oriented and patient cooperative  Airway and Oxygen Therapy: Patient Spontanous Breathing  Post-op Pain: none  Post-op Assessment: Post-op Vital signs reviewed, Patient's Cardiovascular Status Stable, Respiratory Function Stable, Patent Airway, No signs of Nausea or vomiting and Pain level controlled  Post-op Vital Signs: stable  Complications: No apparent anesthesia complications

## 2013-03-18 NOTE — Transfer of Care (Signed)
Immediate Anesthesia Transfer of Care Note  Patient: Deanna Allison  Procedure(s) Performed: Procedure(s): MULTIPLE SURGICAL TEETH EXTRACTIONS (N/A)  Patient Location: PACU  Anesthesia Type:General  Level of Consciousness: awake, alert  and oriented  Airway & Oxygen Therapy: Patient Spontanous Breathing and Patient connected to face mask oxygen  Post-op Assessment: Report given to PACU RN, Post -op Vital signs reviewed and stable and Patient moving all extremities X 4  Post vital signs: Reviewed and stable  Complications: No apparent anesthesia complications

## 2013-03-18 NOTE — Preoperative (Signed)
Beta Blockers   Reason not to administer Beta Blockers:Not Applicable 

## 2013-03-18 NOTE — Brief Op Note (Signed)
03/18/2013  8:58 AM  PATIENT:  [name] Bishop-McKenzie  62 y.o. female  PRE-OPERATIVE DIAGNOSIS:  VON WILLEBRANDS, SLEEP APNEA, HYPERTENSION,  MULTIPLE DENTAL CARRIES  POST-OPERATIVE DIAGNOSIS:  VON WILLEBRANDS, SLEEP APNEA, HYPERTENSION,  MULTIPLE DENTAL CARRIES  PROCEDURE:  Procedure(s): MULTIPLE SURGICAL TEETH EXTRACTIONS (N/A)  SURGEON:  Surgeon(s) and Role:    * Hinton Dyer, DDS - Primary  PHYSICIAN ASSISTANT:   ASSISTANTS: Hadassah Pais, Montel Culver   ANESTHESIA:   general  EBL:  Total I/O In: 1200 [I.V.:1200] Out: -   BLOOD ADMINISTERED:none  DRAINS: none   LOCAL MEDICATIONS USED:  XYLOCAINE   SPECIMEN:  No Specimen and Lavage/Washing  DISPOSITION OF SPECIMEN:  N/A  COUNTS:  YES  TOURNIQUET:  * No tourniquets in log *  DICTATION: .Dragon Dictation  PLAN OF CARE: Discharge to home after PACU  PATIENT DISPOSITION:  PACU - hemodynamically stable.   Delay start of Pharmacological VTE agent (>24hrs) due to surgical blood loss or risk of bleeding: no

## 2013-03-18 NOTE — Op Note (Signed)
The patient was brought to the operating room in the supine position and which remained throughout the whole procedure. She was intubated via right nasoendotracheal tube. Because of a leak in the cuff a large moist throat pack was packed around the endotracheal tube to stop the leak. The mouth was suctioned out and 3 cc of 2% Xylocaine with 1-100,000 epinephrine was given as a block and infiltration around the lower right mandible and right maxilla and palate. A periosteal elevator went around #2 and #3. The roots were sectioned with a Romberg copious irrigation. The teeth were split with an 11-A. elevator. Each root was then removed using a rongeur and an 11-A. elevator. The sockets were curetted and Gelfoam was placed. The soft tissue was closed with multiple 3-0 chromic sutures. A #15 blade made an incision around #29. Buccal flap was elevated with a periosteal elevator. The root of #29 was elevated using an 11-A. elevator. The tooth was removed using a rongeur. The bone was trimmed with a rongeur. The socket was curetted and irrigated. Gelfoam was placed. The soft tissue was closed with a 3-0 chromic suture. Was good hemostasis and the areas were packed off. A #15 blade made an incision over the retained canine. A periosteal elevator then reflected the soft tissue to expose the root. It was elevated out of the socket using a periosteal elevator and a rongeur. The bone was trimmed with a rongeur. The socket was curetted and Gelfoam was placed. The soft tissue was closed with multiple 3-0 chromic sutures. A periosteal elevator went around to retained roots of #15. They were split with an 11-A. elevator and each root was then removed using an 11-A. elevator and a rongeur. The socket was curetted and Gelfoam was placed. The soft tissue was closed with multiple 3-0 chromic sutures. A #15 blade made an incision over the lower left mandible to expose the root of #21. A periosteal elevator reflected a full-thickness  mucoperiosteal flap. The root was elevated using an 11-A. elevator. The socket was curetted and irrigated. Gelfoam was placed. The soft tissue was closed with multiple 3-0 chromic sutures. The patient tolerated the procedure well. The throat pack was left in place until the endotracheal tube was ready to be extubated. When it was removed the throat pack was removed and attention was also inserted. The patient will be followed by me in my private office.

## 2013-03-18 NOTE — Pre-Procedure Instructions (Signed)
No change in medical history.  Patient received DDAVP this morning

## 2013-03-19 ENCOUNTER — Encounter (HOSPITAL_COMMUNITY): Payer: Self-pay | Admitting: Oral Surgery

## 2014-06-14 ENCOUNTER — Emergency Department (HOSPITAL_BASED_OUTPATIENT_CLINIC_OR_DEPARTMENT_OTHER): Payer: Medicare Other

## 2014-06-14 ENCOUNTER — Encounter (HOSPITAL_BASED_OUTPATIENT_CLINIC_OR_DEPARTMENT_OTHER): Payer: Self-pay

## 2014-06-14 ENCOUNTER — Inpatient Hospital Stay (HOSPITAL_BASED_OUTPATIENT_CLINIC_OR_DEPARTMENT_OTHER)
Admission: EM | Admit: 2014-06-14 | Discharge: 2014-06-23 | DRG: 392 | Disposition: A | Discharge status: Home / Self Care | Payer: Medicare Other | Attending: Internal Medicine | Admitting: Internal Medicine

## 2014-06-14 DIAGNOSIS — R112 Nausea with vomiting, unspecified: Secondary | ICD-10-CM | POA: Diagnosis present

## 2014-06-14 DIAGNOSIS — R195 Other fecal abnormalities: Secondary | ICD-10-CM | POA: Insufficient documentation

## 2014-06-14 DIAGNOSIS — K5792 Diverticulitis of intestine, part unspecified, without perforation or abscess without bleeding: Secondary | ICD-10-CM | POA: Diagnosis present

## 2014-06-14 DIAGNOSIS — M797 Fibromyalgia: Secondary | ICD-10-CM | POA: Diagnosis present

## 2014-06-14 DIAGNOSIS — H409 Unspecified glaucoma: Secondary | ICD-10-CM | POA: Diagnosis present

## 2014-06-14 DIAGNOSIS — K5732 Diverticulitis of large intestine without perforation or abscess without bleeding: Secondary | ICD-10-CM | POA: Diagnosis not present

## 2014-06-14 DIAGNOSIS — D860 Sarcoidosis of lung: Secondary | ICD-10-CM | POA: Diagnosis present

## 2014-06-14 DIAGNOSIS — R197 Diarrhea, unspecified: Secondary | ICD-10-CM

## 2014-06-14 DIAGNOSIS — E119 Type 2 diabetes mellitus without complications: Secondary | ICD-10-CM | POA: Diagnosis present

## 2014-06-14 DIAGNOSIS — I10 Essential (primary) hypertension: Secondary | ICD-10-CM | POA: Diagnosis present

## 2014-06-14 DIAGNOSIS — F329 Major depressive disorder, single episode, unspecified: Secondary | ICD-10-CM | POA: Diagnosis present

## 2014-06-14 DIAGNOSIS — A047 Enterocolitis due to Clostridium difficile: Secondary | ICD-10-CM | POA: Diagnosis present

## 2014-06-14 DIAGNOSIS — R1110 Vomiting, unspecified: Secondary | ICD-10-CM | POA: Insufficient documentation

## 2014-06-14 DIAGNOSIS — E876 Hypokalemia: Secondary | ICD-10-CM | POA: Diagnosis present

## 2014-06-14 DIAGNOSIS — G4730 Sleep apnea, unspecified: Secondary | ICD-10-CM | POA: Diagnosis present

## 2014-06-14 DIAGNOSIS — N200 Calculus of kidney: Secondary | ICD-10-CM | POA: Insufficient documentation

## 2014-06-14 DIAGNOSIS — D680 Von Willebrand's disease: Secondary | ICD-10-CM | POA: Diagnosis present

## 2014-06-14 DIAGNOSIS — Z87891 Personal history of nicotine dependence: Secondary | ICD-10-CM

## 2014-06-14 DIAGNOSIS — R509 Fever, unspecified: Secondary | ICD-10-CM

## 2014-06-14 DIAGNOSIS — G4733 Obstructive sleep apnea (adult) (pediatric): Secondary | ICD-10-CM | POA: Diagnosis present

## 2014-06-14 DIAGNOSIS — A0472 Enterocolitis due to Clostridium difficile, not specified as recurrent: Secondary | ICD-10-CM | POA: Insufficient documentation

## 2014-06-14 DIAGNOSIS — H9190 Unspecified hearing loss, unspecified ear: Secondary | ICD-10-CM | POA: Diagnosis present

## 2014-06-14 LAB — CBC WITH DIFFERENTIAL/PLATELET
BASOS PCT: 0 % (ref 0–1)
Basophils Absolute: 0.1 10*3/uL (ref 0.0–0.1)
EOS ABS: 0 10*3/uL (ref 0.0–0.7)
Eosinophils Relative: 0 % (ref 0–5)
HEMATOCRIT: 51.3 % — AB (ref 36.0–46.0)
Hemoglobin: 16.9 g/dL — ABNORMAL HIGH (ref 12.0–15.0)
LYMPHS ABS: 0.7 10*3/uL (ref 0.7–4.0)
LYMPHS PCT: 5 % — AB (ref 12–46)
MCH: 26.5 pg (ref 26.0–34.0)
MCHC: 32.9 g/dL (ref 30.0–36.0)
MCV: 80.5 fL (ref 78.0–100.0)
MONO ABS: 0.6 10*3/uL (ref 0.1–1.0)
MONOS PCT: 4 % (ref 3–12)
NEUTROS ABS: 13 10*3/uL — AB (ref 1.7–7.7)
NEUTROS PCT: 91 % — AB (ref 43–77)
Platelets: 249 10*3/uL (ref 150–400)
RBC: 6.37 MIL/uL — AB (ref 3.87–5.11)
RDW: 13.9 % (ref 11.5–15.5)
WBC: 14.5 10*3/uL — AB (ref 4.0–10.5)

## 2014-06-14 LAB — URINALYSIS, ROUTINE W REFLEX MICROSCOPIC
BILIRUBIN URINE: NEGATIVE
GLUCOSE, UA: NEGATIVE mg/dL
Hgb urine dipstick: NEGATIVE
KETONES UR: 15 mg/dL — AB
Leukocytes, UA: NEGATIVE
Nitrite: NEGATIVE
PROTEIN: NEGATIVE mg/dL
Specific Gravity, Urine: 1.046 — ABNORMAL HIGH (ref 1.005–1.030)
Urobilinogen, UA: 0.2 mg/dL (ref 0.0–1.0)
pH: 5.5 (ref 5.0–8.0)

## 2014-06-14 LAB — COMPREHENSIVE METABOLIC PANEL
ALBUMIN: 4.2 g/dL (ref 3.5–5.2)
ALT: 16 U/L (ref 0–35)
ANION GAP: 10 (ref 5–15)
AST: 29 U/L (ref 0–37)
Alkaline Phosphatase: 106 U/L (ref 39–117)
BUN: 18 mg/dL (ref 6–23)
CALCIUM: 9 mg/dL (ref 8.4–10.5)
CO2: 25 mmol/L (ref 19–32)
Chloride: 99 mmol/L (ref 96–112)
Creatinine, Ser: 0.98 mg/dL (ref 0.50–1.10)
GFR calc Af Amer: 69 mL/min — ABNORMAL LOW (ref >90)
GFR calc non Af Amer: 59 mL/min — ABNORMAL LOW (ref >90)
GLUCOSE: 156 mg/dL — AB (ref 70–99)
Potassium: 3.9 mmol/L (ref 3.5–5.1)
SODIUM: 134 mmol/L — AB (ref 135–145)
Total Bilirubin: 0.8 mg/dL (ref 0.3–1.2)
Total Protein: 8.6 g/dL — ABNORMAL HIGH (ref 6.0–8.3)

## 2014-06-14 LAB — LIPASE, BLOOD: LIPASE: 28 U/L (ref 11–59)

## 2014-06-14 LAB — I-STAT CG4 LACTIC ACID, ED
Lactic Acid, Venous: 2.72 mmol/L (ref 0.5–2.0)
Lactic Acid, Venous: 1.59 mmol/L (ref 0.5–2.0)

## 2014-06-14 MED ORDER — IOHEXOL 300 MG/ML  SOLN
100.0000 mL | Freq: Once | INTRAMUSCULAR | Status: AC | PRN
  Administered 2014-06-14: 100 mL via INTRAVENOUS

## 2014-06-14 MED ORDER — ONDANSETRON HCL 4 MG/2ML IJ SOLN
4.0000 mg | Freq: Once | INTRAMUSCULAR | Status: AC
  Administered 2014-06-14: 4 mg via INTRAVENOUS
  Filled 2014-06-14: qty 2

## 2014-06-14 MED ORDER — METRONIDAZOLE 500 MG PO TABS
500.0000 mg | ORAL_TABLET | Freq: Once | ORAL | Status: AC
  Administered 2014-06-14: 500 mg via ORAL
  Filled 2014-06-14: qty 1

## 2014-06-14 MED ORDER — CIPROFLOXACIN HCL 500 MG PO TABS
500.0000 mg | ORAL_TABLET | Freq: Once | ORAL | Status: AC
  Administered 2014-06-14: 500 mg via ORAL
  Filled 2014-06-14: qty 1

## 2014-06-14 MED ORDER — METHYLPREDNISOLONE SODIUM SUCC 125 MG IJ SOLR
125.0000 mg | Freq: Once | INTRAMUSCULAR | Status: AC
  Administered 2014-06-14: 125 mg via INTRAVENOUS
  Filled 2014-06-14: qty 2

## 2014-06-14 MED ORDER — SODIUM CHLORIDE 0.9 % IV SOLN
INTRAVENOUS | Status: DC
  Administered 2014-06-14 – 2014-06-15 (×2): via INTRAVENOUS

## 2014-06-14 MED ORDER — DIPHENHYDRAMINE HCL 50 MG/ML IJ SOLN
50.0000 mg | Freq: Once | INTRAMUSCULAR | Status: DC
  Filled 2014-06-14: qty 1

## 2014-06-14 MED ORDER — SODIUM CHLORIDE 0.9 % IV BOLUS (SEPSIS)
1000.0000 mL | Freq: Once | INTRAVENOUS | Status: AC
  Administered 2014-06-14: 1000 mL via INTRAVENOUS

## 2014-06-14 MED ORDER — ACETAMINOPHEN 325 MG PO TABS
650.0000 mg | ORAL_TABLET | Freq: Once | ORAL | Status: AC
  Administered 2014-06-14: 650 mg via ORAL
  Filled 2014-06-14: qty 2

## 2014-06-14 MED ORDER — MORPHINE SULFATE 4 MG/ML IJ SOLN
4.0000 mg | INTRAMUSCULAR | Status: DC | PRN
  Administered 2014-06-14 (×2): 4 mg via INTRAVENOUS
  Filled 2014-06-14 (×2): qty 1

## 2014-06-14 MED ORDER — IOHEXOL 300 MG/ML  SOLN
50.0000 mL | Freq: Once | INTRAMUSCULAR | Status: AC | PRN
  Administered 2014-06-14: 50 mL via ORAL

## 2014-06-14 MED ORDER — HYDROMORPHONE HCL 1 MG/ML IJ SOLN
0.5000 mg | Freq: Once | INTRAMUSCULAR | Status: AC
  Administered 2014-06-14: 0.5 mg via INTRAVENOUS
  Filled 2014-06-14: qty 1

## 2014-06-14 MED ORDER — PROMETHAZINE HCL 25 MG/ML IJ SOLN
12.5000 mg | Freq: Once | INTRAMUSCULAR | Status: AC
  Administered 2014-06-14: 12.5 mg via INTRAVENOUS
  Filled 2014-06-14: qty 1

## 2014-06-14 NOTE — Progress Notes (Addendum)
[facility] and also having diarrhea. Patient was scheduled for a Virtual Colonoscopy and had been drinking a bowel prep. She also has some abdominal pain. Transfer for dehydration and IV fluids.  Freda MunroSaadat Eyad Rochford, MD

## 2014-06-14 NOTE — ED Notes (Signed)
Patient called x2 for triage, no answer.

## 2014-06-14 NOTE — ED Notes (Signed)
PA at bedside.

## 2014-06-14 NOTE — ED Provider Notes (Signed)
CSN: [id number]     Arrival date & time 06/14/14  1200 History   First MD Initiated Contact with Patient 06/14/14 1458     Chief Complaint  Patient presents with  . Emesis     (Consider location/radiation/quality/duration/timing/severity/associated sxs/prior Treatment) HPI  Deanna Allison is a 64 y.o. female complaining of multiple episodes of nonbloody, nonbilious, coffee-ground emesis with very loose stools onset at 2 AM last night after patient drank a bowel prep in preparation for a virtual colonoscopy today. Patient reports associated diffuse abdominal and low back pain which she rates it 8 out of 10, exacerbated by palpation. Patient denies fever, chills. States she is passing flatus normally. Review of systems she notes dry mouth  Past Medical History  Diagnosis Date  . Pulmonary sarcoidosis   . Fibromyalgia   . Heart valve disorder     trace AR, mild PR, mild to mod MR/TR, EF 60% 09/22/12 echo ([facility])  . Glaucoma   . Von Willebrand disease   . Diabetes mellitus without complication   . Cataracts, bilateral   . Diverticulitis   . Kidney stone   . PONV (postoperative nausea and vomiting)   . Hypertension     See [facility] at [facility]  . OSA (obstructive sleep apnea)     uses CPAP sleep study > 3 years ago  . Hearing loss of both ears    Past Surgical History  Procedure Laterality Date  . Cholecystectomy    . Appendectomy    . Abdominal hysterectomy    . Hernia repair    . Tibia fracture surgery    . Hand surgery    . Lung biopsy    . Multiple extractions with alveoloplasty N/A 03/18/2013    Procedure: MULTIPLE SURGICAL TEETH EXTRACTIONS;  Surgeon: Hinton Dyer, DDS;  Location: [facility] OR;  Service: Oral Surgery;  Laterality: N/A;   No family history on file. History  Substance Use Topics  . Smoking status: Former Smoker -- 0.50 packs/day for 16 years    Quit date: 04/08/1976  . Smokeless tobacco: Not on file  . Alcohol Use: No   OB History     No data available     Review of Systems  10 systems reviewed and found to be negative, except as noted in the HPI.   Allergies  Cymbalta; Iodine; Ivp dye; Lyrica; Nsaids; Penicillins; and Nickel  Home Medications   Prior to Admission medications   Medication Sig Start Date End Date Taking? Authorizing Provider  brinzolamide (AZOPT) 1 % ophthalmic suspension Place 1 drop into both eyes 2 (two) times daily.     Historical Provider, MD  cholecalciferol (VITAMIN D) 1000 UNITS tablet Take 1,000 Units by mouth daily.    Historical Provider, MD  FLUoxetine HCl 60 MG TABS Take 60 mg by mouth daily.    Historical Provider, MD  HYDROcodone-acetaminophen (HYCET) 7.5-325 mg/15 ml solution Take 10 mLs by mouth every 4 (four) hours as needed for moderate pain. 03/18/13   Felton Clinton, DDS  omeprazole (PRILOSEC) 40 MG capsule Take 40 mg by mouth 2 (two) times daily.    Historical Provider, MD  oxycodone (ROXICODONE) 30 MG immediate release tablet Take 30 mg by mouth every 6 (six) hours as needed for pain.    Historical Provider, MD  tiZANidine (ZANAFLEX) 2 MG tablet Take 2 mg by mouth every 6 (six) hours as needed (muscle spasms).     Historical Provider, MD  verapamil (CALAN-SR) 180 MG CR tablet Take  180 mg by mouth daily.    Historical Provider, MD  zolpidem (AMBIEN) 5 MG tablet Take 2.5-5 mg by mouth at bedtime as needed for sleep.     Historical Provider, MD   BP 170/87 mmHg  Pulse 91  Temp(Src) 98.5 F (36.9 C) (Oral)  Resp 16  Ht 5\' 4"  (1.626 m)  Wt 197 lb (89.359 kg)  BMI 33.80 kg/m2  SpO2 95% Physical Exam  Constitutional: She is oriented to person, place, and time. She appears well-developed and well-nourished. No distress.  HENT:  Head: Normocephalic and atraumatic.  Mouth/Throat: Oropharynx is clear and moist.  Eyes: Conjunctivae and EOM are normal. Pupils are equal, round, and reactive to light.  Neck: Normal range of motion.  Cardiovascular: Normal rate, regular rhythm and  intact distal pulses.   Pulmonary/Chest: Effort normal and breath sounds normal. No stridor.  Abdominal: Soft. Bowel sounds are normal. She exhibits no distension and no mass. There is tenderness. There is no rebound and no guarding.  Diffusely exquisitely tender to palpation in all quadrants.  Musculoskeletal: Normal range of motion.  Neurological: She is alert and oriented to person, place, and time.  Psychiatric: She has a normal mood and affect.  Nursing note and vitals reviewed.   ED Course  Procedures (including critical care time) Labs Review Labs Reviewed  CBC WITH DIFFERENTIAL/PLATELET - Abnormal; Notable for the following:    WBC 14.5 (*)    RBC 6.37 (*)    Hemoglobin 16.9 (*)    HCT 51.3 (*)    Neutrophils Relative % 91 (*)    Neutro Abs 13.0 (*)    Lymphocytes Relative 5 (*)    All other components within normal limits  COMPREHENSIVE METABOLIC PANEL - Abnormal; Notable for the following:    Sodium 134 (*)    Glucose, Bld 156 (*)    Total Protein 8.6 (*)    GFR calc non Af Amer 59 (*)    GFR calc Af Amer 69 (*)    All other components within normal limits  URINALYSIS, ROUTINE W REFLEX MICROSCOPIC - Abnormal; Notable for the following:    Specific Gravity, Urine 1.046 (*)    Ketones, ur 15 (*)    All other components within normal limits  I-STAT CG4 LACTIC ACID, ED - Abnormal; Notable for the following:    Lactic Acid, Venous 2.72 (*)    All other components within normal limits  URINE CULTURE  LIPASE, BLOOD  LACTIC ACID, PLASMA  I-STAT CG4 LACTIC ACID, ED    Imaging Review Ct Abdomen Pelvis W Contrast  06/14/2014   CLINICAL DATA:  Pain around the umbilical area and pain radiating to the back. Nausea and vomiting. All starting today.  EXAM: CT ABDOMEN AND PELVIS WITH CONTRAST  TECHNIQUE: Multidetector CT imaging of the abdomen and pelvis was performed using the standard protocol following bolus administration of intravenous contrast.  CONTRAST:  50mL OMNIPAQUE  IOHEXOL 300 MG/ML SOLN, 100mL OMNIPAQUE IOHEXOL 300 MG/ML SOLN  COMPARISON:  09/07/2012  FINDINGS: Atelectasis in the right lung base with elevation of the right hemidiaphragm. Mild Coronary artery calcification.  Surgical absence of the gallbladder. No bile duct dilatation. The liver, spleen, pancreas, adrenal glands, abdominal aorta, and inferior vena cava are unremarkable. There is a mild hydronephrosis and hydroureter on the right without significant delay in nephrogram. Suggestion of a tiny punctate stone in the distal right ureter. Left kidney and ureter are unremarkable. Moderately prominent lymph nodes in the celiac axis and retroperitoneum.  Largest measuring up to about 10 mm, representing upper limits of normal size. These are likely to be reactive. Appearance is similar prior study. The stomach, small bowel, and colon are not abnormally distended. Diverticula seen throughout the colon. No free air or free fluid in the abdomen. Scarring in the anterior abdominal wall, probably postoperative and similar to prior study.  Pelvis: Diverticulosis of the sigmoid colon with inflammatory changes around the sigmoid region consistent with mild diverticulitis. No abscess. Bladder wall is not thickened. Uterus appears to be surgically absent. No free or loculated pelvic fluid collections. No pelvic mass or lymphadenopathy. There is a small amount of fluid in the right lower quadrant anterior to the ileus psoas muscle. This is nonspecific but may be reactive. Appendix is not specifically identified. Degenerative changes in the spine. No destructive bone lesions.  IMPRESSION: Probable tiny punctate stone in the distal right ureter with mild proximal obstruction. Nonspecific free fluid in the right pelvis over the iliopsoas region. Diverticulosis of sigmoid colon with inflammatory changes consistent with diverticulitis. No abscess. Unchanged appearance of borderline enlarged lymph nodes in the celiac axis and  retroperitoneum, likely reactive.   Electronically Signed   By: Burman Nieves M.D.   On: 06/14/2014 20:07   Dg Abd Acute W/chest  06/14/2014   CLINICAL DATA:  Abdominal pain since yesterday with vomiting  EXAM: ACUTE ABDOMEN SERIES (ABDOMEN 2 VIEW & CHEST 1 VIEW)  COMPARISON:  02/12/2013  FINDINGS: There is a relative paucity of bowel gas. There is no evidence of dilated bowel loops or free intraperitoneal air. No radiopaque calculi or other significant radiographic abnormality is seen. Heart size is within normal limits. There is bilateral mediastinal fullness likely reflecting adenopathy as can be seen with sarcoidosis. Elevation of the right diaphragm. Mild right midlung scarring.  No acute osseous abnormality.  IMPRESSION: Negative abdominal radiographs.  No acute cardiopulmonary disease.   Electronically Signed   By: Elige Ko   On: 06/14/2014 15:46     EKG Interpretation None      MDM   Final diagnoses:  Diverticulitis of large intestine without perforation or abscess without bleeding  Fever, unspecified fever cause  Right kidney stone   Filed Vitals:   06/14/14 1543 06/14/14 1747 06/14/14 2018 06/14/14 2156  BP: 183/92 142/73 151/66 191/99  Pulse: 96 101 92 100  Temp:  98.9 F (37.2 C) 99.6 F (37.6 C) 100.4 F (38 C)  TempSrc:  Oral Oral Oral  Resp: 20 18 18    Height:      Weight:      SpO2: 95% 95% 94% 93%    Medications  0.9 %  sodium chloride infusion ( Intravenous Rate/Dose Change 06/14/14 1517)  morphine 4 MG/ML injection 4 mg (4 mg Intravenous Given 06/14/14 2153)  diphenhydrAMINE (BENADRYL) injection 50 mg (50 mg Intravenous Not Given 06/14/14 1630)  acetaminophen (TYLENOL) tablet 650 mg (not administered)  ondansetron (ZOFRAN) injection 4 mg (4 mg Intravenous Given 06/14/14 1458)  ondansetron (ZOFRAN) injection 4 mg (4 mg Intravenous Given 06/14/14 1627)  sodium chloride 0.9 % bolus 1,000 mL (0 mLs Intravenous Stopped 06/14/14 2155)  HYDROmorphone (DILAUDID)  injection 0.5 mg (0.5 mg Intravenous Given 06/14/14 1625)  methylPREDNISolone sodium succinate (SOLU-MEDROL) 125 mg/2 mL injection 125 mg (125 mg Intravenous Given 06/14/14 1623)  iohexol (OMNIPAQUE) 300 MG/ML solution 50 mL (50 mLs Oral Contrast Given 06/14/14 1653)  promethazine (PHENERGAN) injection 12.5 mg (12.5 mg Intravenous Given 06/14/14 1834)  iohexol (OMNIPAQUE) 300 MG/ML solution 100  mL (100 mLs Intravenous Contrast Given 06/14/14 1950)  metroNIDAZOLE (FLAGYL) tablet 500 mg (500 mg Oral Given 06/14/14 2112)  ciprofloxacin (CIPRO) tablet 500 mg (500 mg Oral Given 06/14/14 2112)    Deanna Allison is a pleasant 64 y.o. female presenting with multiple episodes of nonbloody, nonbilious, coffee ground emesis in addition to diarrhea after patient started taking bowel prep for virtual colonoscopy yesterday. Patient is afebrile, well-appearing and well-hydrated. Abdominal exam is nonfocal however she is so tender to palpation it is reasonable to obtain a CT scan to further evaluate. Lactic acid is mildly elevated at 2.72 and she has a leukocytosis of 14.5. Normal lipase.   Patient states that she has an allergy to both IV and by mouth contrast, states that her allergy is vomiting exclusively. I discussed this with radiologist Dr. Georgana Curio he does not believe this to be a true allergy. Patient will be pretreated with Solu-Medrol and we will proceed with contrast.  Pain is well-controlled in the ED, she is tolerating fluids by mouth. Serial abdominal exams remained nonsurgical.  CT shows a probable tiny punctate stone in the distal right ureter, nonspecific free fluid over the right iliopsoas region. Diverticulitis is noted with no abscess. I've discussed the possibility of admitting the patient, she prefers to go home. I advised full liquid diet for 3 days.  Patient spiked a fever to 100.4. Have discussed this case with attending physician who recommends admission based on her diet BD's, age and  multiple active issues. Patient is amenable to admission.   Pt will be transferred to [facility], case discussed with [facility] hospitalist Dr. Welton Flakes.     Joni Reining Duvall Comes, PA-C 06/14/14 4098  Pricilla Loveless, MD 06/22/14 [phone]

## 2014-06-14 NOTE — ED Notes (Signed)
PA aware that pt cannot take Benadryl.  Pt is speaking with MD about updated plan of care.

## 2014-06-14 NOTE — ED Notes (Signed)
Pt was due to have colonoscopy this morning. Took prep yesterday, having nausea, vomiting, diarrhea and abdominal pain since. Also reports back pain

## 2014-06-15 ENCOUNTER — Encounter (HOSPITAL_COMMUNITY): Payer: Self-pay | Admitting: *Deleted

## 2014-06-15 DIAGNOSIS — R112 Nausea with vomiting, unspecified: Secondary | ICD-10-CM | POA: Diagnosis present

## 2014-06-15 DIAGNOSIS — K5732 Diverticulitis of large intestine without perforation or abscess without bleeding: Secondary | ICD-10-CM | POA: Diagnosis present

## 2014-06-15 DIAGNOSIS — M797 Fibromyalgia: Secondary | ICD-10-CM | POA: Diagnosis present

## 2014-06-15 DIAGNOSIS — G4730 Sleep apnea, unspecified: Secondary | ICD-10-CM

## 2014-06-15 DIAGNOSIS — F329 Major depressive disorder, single episode, unspecified: Secondary | ICD-10-CM | POA: Diagnosis present

## 2014-06-15 DIAGNOSIS — I10 Essential (primary) hypertension: Secondary | ICD-10-CM

## 2014-06-15 DIAGNOSIS — R197 Diarrhea, unspecified: Secondary | ICD-10-CM

## 2014-06-15 DIAGNOSIS — N200 Calculus of kidney: Secondary | ICD-10-CM | POA: Insufficient documentation

## 2014-06-15 DIAGNOSIS — D680 Von Willebrand's disease: Secondary | ICD-10-CM | POA: Diagnosis present

## 2014-06-15 DIAGNOSIS — K5792 Diverticulitis of intestine, part unspecified, without perforation or abscess without bleeding: Secondary | ICD-10-CM | POA: Diagnosis not present

## 2014-06-15 DIAGNOSIS — D860 Sarcoidosis of lung: Secondary | ICD-10-CM | POA: Diagnosis present

## 2014-06-15 DIAGNOSIS — E119 Type 2 diabetes mellitus without complications: Secondary | ICD-10-CM | POA: Diagnosis present

## 2014-06-15 DIAGNOSIS — G4733 Obstructive sleep apnea (adult) (pediatric): Secondary | ICD-10-CM | POA: Diagnosis present

## 2014-06-15 DIAGNOSIS — E876 Hypokalemia: Secondary | ICD-10-CM | POA: Diagnosis present

## 2014-06-15 DIAGNOSIS — H9190 Unspecified hearing loss, unspecified ear: Secondary | ICD-10-CM | POA: Diagnosis present

## 2014-06-15 DIAGNOSIS — A047 Enterocolitis due to Clostridium difficile: Secondary | ICD-10-CM | POA: Diagnosis present

## 2014-06-15 DIAGNOSIS — H409 Unspecified glaucoma: Secondary | ICD-10-CM | POA: Diagnosis present

## 2014-06-15 DIAGNOSIS — Z87891 Personal history of nicotine dependence: Secondary | ICD-10-CM | POA: Diagnosis not present

## 2014-06-15 LAB — COMPREHENSIVE METABOLIC PANEL
ALK PHOS: 83 U/L (ref 39–117)
ALT: 14 U/L (ref 0–35)
AST: 22 U/L (ref 0–37)
Albumin: 3.3 g/dL — ABNORMAL LOW (ref 3.5–5.2)
Anion gap: 9 (ref 5–15)
BILIRUBIN TOTAL: 0.6 mg/dL (ref 0.3–1.2)
BUN: 14 mg/dL (ref 6–23)
CO2: 26 mmol/L (ref 19–32)
Calcium: 8.9 mg/dL (ref 8.4–10.5)
Chloride: 102 mmol/L (ref 96–112)
Creatinine, Ser: 0.82 mg/dL (ref 0.50–1.10)
GFR calc Af Amer: 85 mL/min — ABNORMAL LOW (ref >90)
GFR, EST NON AFRICAN AMERICAN: 73 mL/min — AB (ref >90)
GLUCOSE: 148 mg/dL — AB (ref 70–99)
Potassium: 3.2 mmol/L — ABNORMAL LOW (ref 3.5–5.1)
SODIUM: 137 mmol/L (ref 135–145)
Total Protein: 7.3 g/dL (ref 6.0–8.3)

## 2014-06-15 LAB — CBC WITH DIFFERENTIAL/PLATELET
Basophils Absolute: 0 10*3/uL (ref 0.0–0.1)
Basophils Relative: 0 % (ref 0–1)
EOS PCT: 0 % (ref 0–5)
Eosinophils Absolute: 0 10*3/uL (ref 0.0–0.7)
HCT: 45.3 % (ref 36.0–46.0)
Hemoglobin: 15 g/dL (ref 12.0–15.0)
LYMPHS PCT: 5 % — AB (ref 12–46)
Lymphs Abs: 0.7 10*3/uL (ref 0.7–4.0)
MCH: 26.8 pg (ref 26.0–34.0)
MCHC: 33.1 g/dL (ref 30.0–36.0)
MCV: 81 fL (ref 78.0–100.0)
MONO ABS: 1.1 10*3/uL — AB (ref 0.1–1.0)
Monocytes Relative: 8 % (ref 3–12)
NEUTROS PCT: 87 % — AB (ref 43–77)
Neutro Abs: 11.6 10*3/uL — ABNORMAL HIGH (ref 1.7–7.7)
Platelets: 255 10*3/uL (ref 150–400)
RBC: 5.59 MIL/uL — ABNORMAL HIGH (ref 3.87–5.11)
RDW: 13.3 % (ref 11.5–15.5)
WBC: 13.4 10*3/uL — ABNORMAL HIGH (ref 4.0–10.5)

## 2014-06-15 MED ORDER — ZOLPIDEM TARTRATE 5 MG PO TABS
2.5000 mg | ORAL_TABLET | Freq: Every evening | ORAL | Status: DC | PRN
  Administered 2014-06-17 – 2014-06-22 (×5): 5 mg via ORAL
  Filled 2014-06-15 (×7): qty 1

## 2014-06-15 MED ORDER — ACETAMINOPHEN 325 MG PO TABS
650.0000 mg | ORAL_TABLET | Freq: Four times a day (QID) | ORAL | Status: DC | PRN
  Administered 2014-06-15: 650 mg via ORAL
  Filled 2014-06-15: qty 2

## 2014-06-15 MED ORDER — BRINZOLAMIDE 1 % OP SUSP
1.0000 [drp] | Freq: Two times a day (BID) | OPHTHALMIC | Status: DC
  Administered 2014-06-15 – 2014-06-23 (×17): 1 [drp] via OPHTHALMIC
  Filled 2014-06-15: qty 10

## 2014-06-15 MED ORDER — VERAPAMIL HCL ER 180 MG PO TBCR
180.0000 mg | EXTENDED_RELEASE_TABLET | Freq: Every day | ORAL | Status: DC
  Administered 2014-06-16 – 2014-06-23 (×7): 180 mg via ORAL
  Filled 2014-06-15 (×9): qty 1

## 2014-06-15 MED ORDER — HYDRALAZINE HCL 20 MG/ML IJ SOLN
5.0000 mg | Freq: Four times a day (QID) | INTRAMUSCULAR | Status: DC | PRN
  Administered 2014-06-15 – 2014-06-20 (×4): 5 mg via INTRAVENOUS
  Filled 2014-06-15 (×4): qty 1

## 2014-06-15 MED ORDER — FLUOXETINE HCL 20 MG PO CAPS
60.0000 mg | ORAL_CAPSULE | Freq: Every day | ORAL | Status: DC
  Administered 2014-06-20 – 2014-06-23 (×4): 60 mg via ORAL
  Filled 2014-06-15 (×9): qty 3

## 2014-06-15 MED ORDER — OXYCODONE HCL 5 MG PO TABS
30.0000 mg | ORAL_TABLET | Freq: Four times a day (QID) | ORAL | Status: DC | PRN
  Administered 2014-06-15: 30 mg via ORAL
  Filled 2014-06-15 (×2): qty 6

## 2014-06-15 MED ORDER — ONDANSETRON HCL 4 MG PO TABS: 4.0000 mg | ORAL_TABLET | Freq: Four times a day (QID) | ORAL | Status: DC | PRN

## 2014-06-15 MED ORDER — HYDROMORPHONE HCL 1 MG/ML IJ SOLN
0.5000 mg | INTRAMUSCULAR | Status: DC | PRN
  Administered 2014-06-15 – 2014-06-21 (×16): 0.5 mg via INTRAVENOUS
  Filled 2014-06-15 (×17): qty 1

## 2014-06-15 MED ORDER — METRONIDAZOLE IN NACL 5-0.79 MG/ML-% IV SOLN
500.0000 mg | Freq: Three times a day (TID) | INTRAVENOUS | Status: DC
  Administered 2014-06-15 – 2014-06-18 (×10): 500 mg via INTRAVENOUS
  Filled 2014-06-15 (×12): qty 100

## 2014-06-15 MED ORDER — PANTOPRAZOLE SODIUM 40 MG PO TBEC
40.0000 mg | DELAYED_RELEASE_TABLET | Freq: Every day | ORAL | Status: DC
  Administered 2014-06-16 – 2014-06-20 (×3): 40 mg via ORAL
  Filled 2014-06-15 (×4): qty 1

## 2014-06-15 MED ORDER — TIZANIDINE HCL 2 MG PO TABS
2.0000 mg | ORAL_TABLET | Freq: Four times a day (QID) | ORAL | Status: DC | PRN
  Filled 2014-06-15: qty 1

## 2014-06-15 MED ORDER — ACETAMINOPHEN 650 MG RE SUPP
650.0000 mg | Freq: Four times a day (QID) | RECTAL | Status: DC | PRN
Start: 2014-06-15 — End: 2014-06-23

## 2014-06-15 MED ORDER — SODIUM CHLORIDE 0.9 % IV BOLUS (SEPSIS)
500.0000 mL | Freq: Once | INTRAVENOUS | Status: AC
  Administered 2014-06-15: 500 mL via INTRAVENOUS

## 2014-06-15 MED ORDER — PROMETHAZINE HCL 25 MG/ML IJ SOLN
12.5000 mg | Freq: Four times a day (QID) | INTRAMUSCULAR | Status: DC | PRN
  Administered 2014-06-15 – 2014-06-19 (×3): 12.5 mg via INTRAVENOUS
  Filled 2014-06-15 (×3): qty 1

## 2014-06-15 MED ORDER — CEFTRIAXONE SODIUM IN DEXTROSE 20 MG/ML IV SOLN
1.0000 g | Freq: Every day | INTRAVENOUS | Status: DC
  Administered 2014-06-15 – 2014-06-17 (×4): 1 g via INTRAVENOUS
  Filled 2014-06-15 (×5): qty 50

## 2014-06-15 MED ORDER — ONDANSETRON HCL 4 MG/2ML IJ SOLN
4.0000 mg | Freq: Four times a day (QID) | INTRAMUSCULAR | Status: DC | PRN
  Administered 2014-06-15 – 2014-06-23 (×6): 4 mg via INTRAVENOUS
  Filled 2014-06-15 (×6): qty 2

## 2014-06-15 MED ORDER — SODIUM CHLORIDE 0.9 % IV SOLN
INTRAVENOUS | Status: AC
  Administered 2014-06-15: 03:00:00 via INTRAVENOUS

## 2014-06-15 NOTE — Progress Notes (Signed)
Pt arrived to room on floor. Pt is resting comfortably at this time. Admission will be completed now.

## 2014-06-15 NOTE — Progress Notes (Addendum)
Patient ID: Deanna Allison, female   DOB: 01/18/1951, 64 y.o.   MRN: [id number] [facility] PROGRESS NOTE  Deanna Allison [id number] DOB: [birth year] DOA: 06/14/2014 PCP: Deanna Allison., MD  Brief narrative:    64 y.o. female history of hypertension, sleep apnea, pulmonary sarcoidosis, Von Willebrand's disease who presented to [facility] ED with ongoing nausea, vomiting, diarrhea and abdominal pain for past 3 days PTA. She was supposed to have prep for virtual colonoscopy. She was advised to come to ED for further evaluation.  Pt was hemodynamically stable on admission. Her T max was 100.4 F. She had WBC count 14.5 and CT abdomen showed likely acute sigmoid diverticulitis. She was started on cipro (then changed to rocephin) and flagyl.   Assessment/Plan:    Principal Problem: Acute sigmoid diverticulitis / Abdominal pain, nausea, vomiting, diarrhea / Leukocytosis  - No further episodes of diarrhea but still nausea and vomiting. - CT abdomen on admission showed diverticulosis of sigmoid colon with inflammatory changes consistent with diverticulitis. No abscess. - Continue rocephin and flagyl  - Continue supportive care with IV fluids, analgesia and antiemetics as needed - Clear liquid diet  Active Problems: Right kidney stone  - Pt voiding freely - Creatinine WNL    Essential hypertension - Continue verapamil 180 mg daily - Hydralazine PRN for better BP control     Depression - Continue Prozac 60 mg daily     DVT Prophylaxis  - SCD's bilaterally    Code Status: Full.  Family Communication:  plan of care discussed with the patient Disposition Plan: Home once she tolerates regular diet, this am on CLD  IV access:  Peripheral IV  Procedures and diagnostic studies:    Ct Abdomen Pelvis W Contrast 06/14/2014    Probable tiny punctate stone in the distal right ureter with mild proximal obstruction. Nonspecific free fluid in the right pelvis over the iliopsoas  region. Diverticulosis of sigmoid colon with inflammatory changes consistent with diverticulitis. No abscess. Unchanged appearance of borderline enlarged lymph nodes in the celiac axis and retroperitoneum, likely reactive.     Dg Abd Acute W/chest 06/14/2014 Negative abdominal radiographs.  No acute cardiopulmonary disease.     Medical Consultants:  None   Other Consultants:  None   IAnti-Infectives:   Cipro one time dose on admission. Flagyl 06/14/2014 --> Rocephin 06/15/2014 -->    Manson Passey, MD  [facility] Pager [phone]  If 7PM-7AM, please contact night-coverage [email or web address] Password [email or web address] 06/15/2014, 10:27 AM   LOS: 0 days    HPI/Subjective: No acute overnight events.  Objective: Filed Vitals:   06/15/14 0011 06/15/14 0059 06/15/14 0517 06/15/14 0827  BP: 175/99 131/79 173/96   Pulse: 102 102 85   Temp:  98.8 F (37.1 C) 99 F (37.2 C) 98.2 F (36.8 C)  TempSrc:  Oral Oral Oral  Resp: Height:      Weight:      SpO2: 97% 96% 96%     Intake/Output Summary (Last 24 hours) at 06/15/14 1027 Last data filed at 06/15/14 0855  Gross per 24 hour  Intake    300 ml  Output    600 ml  Net   -300 ml    Exam:   General:  Pt is alert, follows commands appropriately, not in acute distress  Cardiovascular: Regular rate and rhythm, S1/S2, no murmurs  Respiratory: Clear to auscultation bilaterally, no wheezing, no crackles, no rhonchi  Abdomen: tender in mid abdomen, no guarding or rebound  tenderness, bowel sounds present  Extremities: No edema, pulses DP and PT palpable bilaterally  Neuro: Grossly nonfocal  Data Reviewed: Basic Metabolic Panel:  Recent Labs Lab 06/14/14 1300  NA 134*  K 3.9  CL 99  CO2 25  GLUCOSE 156*  BUN 18  CREATININE 0.98  CALCIUM 9.0   Liver Function Tests:  Recent Labs Lab 06/14/14 1300  AST 29  ALT 16  ALKPHOS 106  BILITOT 0.8  PROT 8.6*  ALBUMIN 4.2    Recent Labs Lab 06/14/14 1300   LIPASE 28   No results for input(s): AMMONIA in the last 168 hours. CBC:  Recent Labs Lab 06/14/14 1300  WBC 14.5*  NEUTROABS 13.0*  HGB 16.9*  HCT 51.3*  MCV 80.5  PLT 249   Cardiac Enzymes: No results for input(s): CKTOTAL, CKMB, CKMBINDEX, TROPONINI in the last 168 hours. BNP: Invalid input(s): POCBNP CBG: No results for input(s): GLUCAP in the last 168 hours.  No results found for this or any previous visit (from the past 240 hour(s)).   Scheduled Meds: . brinzolamide  1 drop Both Eyes BID  . cefTRIAXone (ROCEPHIN)  IV  1 g Intravenous QHS  . FLUoxetine  60 mg Oral Daily  . metronidazole  500 mg Intravenous Q8H  . pantoprazole  40 mg Oral Daily  . verapamil  180 mg Oral Daily   Continuous Infusions: . sodium chloride 100 mL/hr at 06/15/14 0232

## 2014-06-15 NOTE — Progress Notes (Signed)
Placed patient on CPAP on her home pressure of 8 cm H2O with a nasal mask.  Patient uses nasal prongs at home.  Patient tolerating CPAP well.

## 2014-06-15 NOTE — Progress Notes (Signed)
Patient has home CPAP at bedside and places on/off herself.  Patient stated she did not need any assistance.

## 2014-06-15 NOTE — H&P (Signed)
[facility] History and Physical  Deanna Allison ZOX:[id number] DOB: 12/04/1950 DOA: 06/14/2014  Referring physician: Patient was transferred from [facility] [place]. PCP: Deanna Allison   Chief Complaint: Nausea vomiting diarrhea and abdominal pain.  HPI: Deanna Allison is a 64 y.o. female history of hypertension, sleep apnea, pulmonary sarcoidosis, Von Willebrand's disease presented to the ER because of nausea vomiting and abdominal pain and diarrhea. Patient's symptoms started when patient started having the prepp for virtual colonoscopy 3 days ago. Patient was having persistent nausea vomiting and diarrhea and eventually started developing lower abdominal pain. Patient was advised to go to the ER. In the ER CT abdomen and pelvis shows diverticulitis of the sigmoid colon and patient was mildly febrile. Patient has been admitted for further management. Patient denies any recent travel or sick contacts or any recent use of antibiotics. On my exam patient's abdominal pain is largely improved after patient received morphine in the ER.  Review of Systems: As presented in the history of presenting illness, rest negative.  Past Medical History  Diagnosis Date  . Pulmonary sarcoidosis   . Fibromyalgia   . Heart valve disorder     trace AR, mild PR, mild to mod MR/TR, EF 60% 09/22/12 echo ([facility])  . Glaucoma   . Von Willebrand disease   . Diabetes mellitus without complication   . Cataracts, bilateral   . Diverticulitis   . Kidney stone   . PONV (postoperative nausea and vomiting)   . Hypertension     [facility]  . OSA (obstructive sleep apnea)     uses CPAP sleep study > 3 years ago  . Hearing loss of both ears    Past Surgical History  Procedure Laterality Date  . Cholecystectomy    . Appendectomy    . Abdominal hysterectomy    . Hernia repair    . Tibia fracture surgery    . Hand surgery    . Lung biopsy    . Multiple  extractions with alveoloplasty N/A 03/18/2013    Procedure: MULTIPLE SURGICAL TEETH EXTRACTIONS;  Surgeon: Hinton DyerJoseph L Miller, DDS;  Location: [facility] OR;  Service: Oral Surgery;  Laterality: N/A;   Social History:  reports that she quit smoking about 38 years ago. She does not have any smokeless tobacco history on file. She reports that she uses illicit drugs (Marijuana). She reports that she does not drink alcohol. Where does patient live home. Can patient participate in ADLs? Yes.  Allergies  Allergen Reactions  . Benadryl [Diphenhydramine Hcl (Sleep)]     Hallucinations   . Cymbalta [Duloxetine Hcl] Nausea And Vomiting  . Iodine Nausea And Vomiting  . Ivp Dye [Iodinated Diagnostic Agents]   . Lyrica [Pregabalin]     Depression  . Nsaids     Sits in my stomach  . Penicillins Hives  . Nickel Rash    Family History: History reviewed. No pertinent family history.    Prior to Admission medications   Medication Sig Start Date End Date Taking? Authorizing Provider  brinzolamide (AZOPT) 1 % ophthalmic suspension Place 1 drop into both eyes 2 (two) times daily.     Historical Provider, Allison  cholecalciferol (VITAMIN D) 1000 UNITS tablet Take 1,000 Units by mouth daily.    Historical Provider, Allison  FLUoxetine HCl 60 MG TABS Take 60 mg by mouth daily.    Historical Provider, Allison  HYDROcodone-acetaminophen (HYCET) 7.5-325 mg/15 ml solution Take 10 mLs by mouth every 4 (  four) hours as needed for moderate pain. 03/18/13   Felton Clinton, DDS  omeprazole (PRILOSEC) 40 MG capsule Take 40 mg by mouth 2 (two) times daily.    Historical Provider, Allison  oxycodone (ROXICODONE) 30 MG immediate release tablet Take 30 mg by mouth every 6 (six) hours as needed for pain.    Historical Provider, Allison  tiZANidine (ZANAFLEX) 2 MG tablet Take 2 mg by mouth every 6 (six) hours as needed (muscle spasms).     Historical Provider, Allison  verapamil (CALAN-SR) 180 MG CR tablet Take 180 mg by mouth daily.    Historical Provider, Allison   zolpidem (AMBIEN) 5 MG tablet Take 2.5-5 mg by mouth at bedtime as needed for sleep.     Historical Provider, Allison    Physical Exam: Filed Vitals:   06/14/14 2328 06/15/14 0000 06/15/14 0011 06/15/14 0059  BP: 163/87 163/87 175/99 131/79  Pulse: 102 96 102 102  Temp: 99 F (37.2 C)   98.8 F (37.1 C)  TempSrc:    Oral  Resp: Height:      Weight:      SpO2: 95% 96% 97% 96%     General:  Well-developed and nourished.  Eyes: Anicteric no pallor.  ENT: No discharge from the ears eyes nose or mouth.  Neck: No mass felt.  Cardiovascular: S1-S2 heard.  Respiratory: No rhonchi or crepitations.  Abdomen: Soft nontender bowel sounds present. No guarding or rigidity.  Skin: No rash.  Musculoskeletal: No edema.  Psychiatric: Appears normal.  Neurologic: Alert awake oriented to time place and person. Moves all extremities.  Labs on Admission:  Basic Metabolic Panel:  Recent Labs Lab 06/14/14 1300  NA 134*  K 3.9  CL 99  CO2 25  GLUCOSE 156*  BUN 18  CREATININE 0.98  CALCIUM 9.0   Liver Function Tests:  Recent Labs Lab 06/14/14 1300  AST 29  ALT 16  ALKPHOS 106  BILITOT 0.8  PROT 8.6*  ALBUMIN 4.2    Recent Labs Lab 06/14/14 1300  LIPASE 28   No results for input(s): AMMONIA in the last 168 hours. CBC:  Recent Labs Lab 06/14/14 1300  WBC 14.5*  NEUTROABS 13.0*  HGB 16.9*  HCT 51.3*  MCV 80.5  PLT 249   Cardiac Enzymes: No results for input(s): CKTOTAL, CKMB, CKMBINDEX, TROPONINI in the last 168 hours.  BNP (last 3 results) No results for input(s): BNP in the last 8760 hours.  ProBNP (last 3 results) No results for input(s): PROBNP in the last 8760 hours.  CBG: No results for input(s): GLUCAP in the last 168 hours.  Radiological Exams on Admission: Ct Abdomen Pelvis W Contrast  06/14/2014   CLINICAL DATA:  Pain around the umbilical area and pain radiating to the back. Nausea and vomiting. All starting today.  EXAM: CT  ABDOMEN AND PELVIS WITH CONTRAST  TECHNIQUE: Multidetector CT imaging of the abdomen and pelvis was performed using the standard protocol following bolus administration of intravenous contrast.  CONTRAST:  50mL OMNIPAQUE IOHEXOL 300 MG/ML SOLN, OMNIPAQUE IOHEXOL 300 MG/ML SOLN  COMPARISON:  09/07/2012  FINDINGS: Atelectasis in the right lung base with elevation of the right hemidiaphragm. Mild Coronary artery calcification.  Surgical absence of the gallbladder. No bile duct dilatation. The liver, spleen, pancreas, adrenal glands, abdominal aorta, and inferior vena cava are unremarkable. There is a mild hydronephrosis and hydroureter on the right without significant delay in nephrogram. Suggestion of a tiny punctate stone in  the distal right ureter. Left kidney and ureter are unremarkable. Moderately prominent lymph nodes in the celiac axis and retroperitoneum. Largest measuring up to about 10 mm, representing upper limits of normal size. These are likely to be reactive. Appearance is similar prior study. The stomach, small bowel, and colon are not abnormally distended. Diverticula seen throughout the colon. No free air or free fluid in the abdomen. Scarring in the anterior abdominal wall, probably postoperative and similar to prior study.  Pelvis: Diverticulosis of the sigmoid colon with inflammatory changes around the sigmoid region consistent with mild diverticulitis. No abscess. Bladder wall is not thickened. Uterus appears to be surgically absent. No free or loculated pelvic fluid collections. No pelvic mass or lymphadenopathy. There is a small amount of fluid in the right lower quadrant anterior to the ileus psoas muscle. This is nonspecific but may be reactive. Appendix is not specifically identified. Degenerative changes in the spine. No destructive bone lesions.  IMPRESSION: Probable tiny punctate stone in the distal right ureter with mild proximal obstruction. Nonspecific free fluid in the right  pelvis over the iliopsoas region. Diverticulosis of sigmoid colon with inflammatory changes consistent with diverticulitis. No abscess. Unchanged appearance of borderline enlarged lymph nodes in the celiac axis and retroperitoneum, likely reactive.   Electronically Signed   By: Burman Nieves M.D.   On: 06/14/2014 20:07   Dg Abd Acute W/chest  06/14/2014   CLINICAL DATA:  Abdominal pain since yesterday with vomiting  EXAM: ACUTE ABDOMEN SERIES (ABDOMEN 2 VIEW & CHEST 1 VIEW)  COMPARISON:  02/12/2013  FINDINGS: There is a relative paucity of bowel gas. There is no evidence of dilated bowel loops or free intraperitoneal air. No radiopaque calculi or other significant radiographic abnormality is seen. Heart size is within normal limits. There is bilateral mediastinal fullness likely reflecting adenopathy as can be seen with sarcoidosis. Elevation of the right diaphragm. Mild right midlung scarring.  No acute osseous abnormality.  IMPRESSION: Negative abdominal radiographs.  No acute cardiopulmonary disease.   Electronically Signed   By: Elige Ko   On: 06/14/2014 15:46    Assessment/Plan Active Problems:   Diverticulitis   Acute diverticulitis   Nausea vomiting and diarrhea   Essential hypertension   Sleep apnea   1. Acute sigmoid diverticulitis - patient has been placed on Flagyl and ceftriaxone. Check stool studies. Continue with gentle hydration for now. I have placed patient on clear liquid diet and if patient's abdominal pain nausea and vomiting improves and may advance diet. 2. Nausea vomiting and diarrhea - patient's symptoms started after the prep. Patient did not have any sick contacts. Check stool studies and C. difficile. 3. Hypertension - continue present medications. 4. Sleep apnea - CPAP at bedtime. 5. History of pulmonary sarcoidosis - was recently on steroids last month. 6. History of Von Willebrand's disease.   DVT Prophylaxis SCDs.  Code Status: Full code.  Family  Communication: None.  Disposition Plan: Admit to inpatient.    Lyndsee Casa N. [facility] Pager [phone].  If 7PM-7AM, please contact night-coverage [email or web address] Password [email or web address] 06/15/2014, 1:45 AM

## 2014-06-15 NOTE — Progress Notes (Signed)
Pt has been placed on Enteric precautions to rule out C.Diff. Explained to pt what this means and explained how to decrease the spread of infection. Pt verbalizes understanding at this time.

## 2014-06-15 NOTE — Progress Notes (Signed)
Pt has temp of 103.0. Tylenol 650mg  PO was given and MD has been notified. Will wait for any additional orders and continue to monitor.

## 2014-06-16 LAB — URINE CULTURE: Colony Count: >=100000

## 2014-06-16 LAB — CLOSTRIDIUM DIFFICILE BY PCR: Toxigenic C. Difficile by PCR: POSITIVE — AB

## 2014-06-16 MED ORDER — METRONIDAZOLE IN NACL 5-0.79 MG/ML-% IV SOLN: 500.0000 mg | Freq: Three times a day (TID) | INTRAVENOUS | Status: DC

## 2014-06-16 NOTE — Progress Notes (Signed)
Pt. Has her cpap from home and able to operate herself. RT informed pt. To notify if she needed any assistance.

## 2014-06-16 NOTE — Progress Notes (Addendum)
Patient ID: Deanna Allison, female   DOB: 01-Nov-1950, 64 y.o.   MRN: [id number] [facility] PROGRESS NOTE  Deanna Allison WUJ:[id number] DOB: [birth year] DOA: 06/14/2014 PCP: Angelica Chessman., MD  Brief narrative:    64 y.o. female history of hypertension, sleep apnea, pulmonary sarcoidosis, Von Willebrand's disease who presented to [facility] ED with ongoing nausea, vomiting, diarrhea and abdominal pain for past 3 days PTA. She was supposed to have prep for virtual colonoscopy. She was advised to come to ED for further evaluation.  Pt was hemodynamically stable on admission. Her T max was 100.4 F. She had WBC count 14.5 and CT abdomen showed likely acute sigmoid diverticulitis. She was started on cipro (then changed to rocephin) and flagyl.   Assessment/Plan:    Principal Problem: Acute sigmoid diverticulitis / Abdominal pain, nausea, vomiting, diarrhea / Leukocytosis  - Diarrhea resolved. Still nausea and vomiting. - Patient was found to have sigmoid diverticulitis based on CT abdomen. - She is on Rocephin and Flagyl. - We will continue supportive care with IV fluids for hydration, analgesia and antiemetics as needed - Continue clear liquid diet.  Active Problems: Distal right ureter punctate stone with mild proximal obstruction - Pt voiding freely - Creatinine WNL    Essential hypertension - Continue verapamil 180 mg daily - blood pressure 149/77    Depression - Continue Prozac 60 mg daily     DVT Prophylaxis  - SCD's bilaterally while patient is in hospital.   Code Status: Full.  Family Communication:  plan of care discussed with the patient Disposition Plan: Still nauseous and had one episode of vomiting earlier. We will continue clear liquid diet and hopefully advance either later today or tomorrow. Not stable for discharge at this time  IV access:  Peripheral IV  Procedures and diagnostic studies:    Ct Abdomen Pelvis W Contrast 06/14/2014    Probable tiny  punctate stone in the distal right ureter with mild proximal obstruction. Nonspecific free fluid in the right pelvis over the iliopsoas region. Diverticulosis of sigmoid colon with inflammatory changes consistent with diverticulitis. No abscess. Unchanged appearance of borderline enlarged lymph nodes in the celiac axis and retroperitoneum, likely reactive.     Dg Abd Acute W/chest 06/14/2014 Negative abdominal radiographs.  No acute cardiopulmonary disease.     Medical Consultants:  None   Other Consultants:  None   IAnti-Infectives:   Cipro one time dose on admission. Flagyl 06/14/2014 --> Rocephin 06/15/2014 -->    Manson Passey, MD  [facility] Pager [phone]  If 7PM-7AM, please contact night-coverage [email or web address] Password [facility] 06/16/2014, 9:49 AM   LOS: 2 day    HPI/Subjective: No acute overnight events.  Objective: Filed Vitals:   06/15/14 1436 06/15/14 2207 06/16/14 0514 06/16/14 0917  BP: 161/99 145/80 182/94 149/77  Pulse: 102 104 100   Temp: 99.8 F (37.7 C) 98.8 F (37.1 C) 98.9 F (37.2 C)   TempSrc: Oral Oral Oral   Resp: Height:      Weight:      SpO2: 97% 97% 92%     Intake/Output Summary (Last 24 hours) at 06/16/14 0949 Last data filed at 06/16/14 0854  Gross per 24 hour  Intake   1325 ml  Output   2450 ml  Net  -1125 ml    Exam:   General:  Pt is not in acute distress  Cardiovascular: Regular rate and rhythm, S1/S2 appreciated  Respiratory: Bilateral air entry, no wheezing  Abdomen:  Nontender to palpation, no distention, appreciate bowel sounds  Extremities: No lower extremity swelling, no cyanosis  Neuro: No focal deficits  Data Reviewed: Basic Metabolic Panel:  Recent Labs Lab 06/14/14 1300 06/15/14 1120  NA 134* 137  K 3.9 3.2*  CL 99 102  CO2 25 26  GLUCOSE 156* 148*  BUN 18 14  CREATININE 0.98 0.82  CALCIUM 9.0 8.9   Liver Function Tests:  Recent Labs Lab 06/14/14 1300 06/15/14 1120  AST  29 22  ALT 16 14  ALKPHOS 106 83  BILITOT 0.8 0.6  PROT 8.6* 7.3  ALBUMIN 4.2 3.3*    Recent Labs Lab 06/14/14 1300  LIPASE 28   No results for input(s): AMMONIA in the last 168 hours. CBC:  Recent Labs Lab 06/14/14 1300 06/15/14 1120  WBC 14.5* 13.4*  NEUTROABS 13.0* 11.6*  HGB 16.9* 15.0  HCT 51.3* 45.3  MCV 80.5 81.0  PLT 249 255   Cardiac Enzymes: No results for input(s): CKTOTAL, CKMB, CKMBINDEX, TROPONINI in the last 168 hours. BNP: Invalid input(s): POCBNP CBG: No results for input(s): GLUCAP in the last 168 hours.  No results found for this or any previous visit (from the past 240 hour(s)).   Scheduled Meds: . brinzolamide  1 drop Both Eyes BID  . cefTRIAXone (ROCEPHIN)  IV  1 g Intravenous QHS  . FLUoxetine  60 mg Oral Daily  . metronidazole  500 mg Intravenous Q8H  . pantoprazole  40 mg Oral Daily  . verapamil  180 mg Oral Daily   Continuous Infusions:

## 2014-06-17 DIAGNOSIS — A047 Enterocolitis due to Clostridium difficile: Secondary | ICD-10-CM

## 2014-06-17 MED ORDER — HYDROCODONE-ACETAMINOPHEN 5-325 MG PO TABS
1.0000 | ORAL_TABLET | ORAL | Status: DC | PRN
  Administered 2014-06-17 – 2014-06-18 (×2): 1 via ORAL
  Filled 2014-06-17 (×3): qty 1

## 2014-06-17 NOTE — Care Management Note (Signed)
    Page 1 of 1   06/23/2014     12:45:32 PM CARE MANAGEMENT NOTE 06/23/2014  Patient:  Deanna Allison,Deanna Allison   Account Number:  [id number]  Date Initiated:  06/17/2014  Documentation initiated by:  Elmer BalesOBARGE,COURTNEY  Subjective/Objective Assessment:   Patient was admitted with vomiting, abdominal pain.  Lives at home with spouse.     Action/Plan:   Will follow for discharge needs.   Anticipated DC Date:  06/23/2014   Anticipated DC Plan:  HOME W HOME HEALTH SERVICES      DC Planning Services  CM consult      [facility]   Choice offered to / List presented to:  C-1 Patient        HH arranged  HH-1 RN  HH-2 PT      [facility]  [facility]   Status of service:  Completed, signed off Medicare Important Message given?  YES (If response is "NO", the following Medicare IM given date fields will be blank) Date Medicare IM given:  06/17/2014 Medicare IM given by:  Elmer BalesOBARGE,COURTNEY Date Additional Medicare IM given:  06/23/2014 Additional Medicare IM given by:  Letha CapeEBORAH Yoav Okane  Discharge Disposition:  HOME [facility] IW HOME HEALTH SERVICES  Per UR Regulation:  Reviewed for med. necessity/level of care/duration of stay  If discussed at Long Length of Stay Meetings, dates discussed:    Comments:  06/23/14 1244 Letha Capeeborah Thadius Smisek RN, BSN [phone] patient for dc , [facility], referral made, soc will begin 24-48 hrs post dc.  06/17/14 1010 Elmer Balesourtney Robarge RN, MSN, CM- Medicare IM letter provided.

## 2014-06-17 NOTE — Progress Notes (Addendum)
Patient ID: Deanna Allison, female   DOB: 1950/11/29, 64 y.o.   MRN: [id number] [facility] PROGRESS NOTE  [name] WUJ:[id number] DOB: [birth year] DOA: 06/14/2014 PCP: Angelica Chessman., MD  Brief narrative:    64 y.o. female history of hypertension, sleep apnea, pulmonary sarcoidosis, Von Willebrand's disease who presented to [facility] ED with ongoing nausea, vomiting, diarrhea and abdominal pain for past 3 days PTA. She was supposed to have prep for virtual colonoscopy. She was advised to come to ED for further evaluation.  Pt was hemodynamically stable on admission. Her T max was 100.4 F. She had WBC count 14.5 and CT abdomen showed likely acute sigmoid diverticulitis. She was started on cipro (then changed to rocephin) and flagyl.  Hospital course complicated with finding of C. difficile on PCR.  Assessment/Plan:    Principal Problem: Acute sigmoid diverticulitis / Abdominal pain, nausea, vomiting, diarrhea  - Nausea still present. Vomiting and diarrhea improved. - Patient was found to have sigmoid diverticulitis based on CT abdomen. - As mentioned above, patient was started on Flagyl and Cipro but Cipro subsequently changed to Rocephin. - She is on clear liquid diet. Still feels nausea but no vomiting. - We'll advance diet to regular and see if she feels better. - Continue supportive care with low rate IV fluids, analgesia and antiemetics as needed.  Active Problems: C. difficile enteritis / leukocytosis - C. difficile positive on C. difficile PCR - Patient was already on Flagyl as part of the treatment for sigmoid diverticulitis. - Has had one episode of diarrhea in past 24 hours.  Distal right ureter punctate stone with mild proximal obstruction - Pt voiding freely - Creatinine WNL    Essential hypertension - Continue verapamil 180 mg daily - Blood pressure 116/63    Depression - Stable - Continue Prozac 60 mg daily    DVT Prophylaxis  - SCD's  bilaterally while patient is in hospital.   Code Status: Full.  Family Communication:  plan of care discussed with the patient Disposition Plan: Home once by mouth intake improves. We will try to advance diet to regular today and see if she tolerates it. Possibly home tomorrow.  IV access:  Peripheral IV  Procedures and diagnostic studies:    Ct Abdomen Pelvis W Contrast 06/14/2014    Probable tiny punctate stone in the distal right ureter with mild proximal obstruction. Nonspecific free fluid in the right pelvis over the iliopsoas region. Diverticulosis of sigmoid colon with inflammatory changes consistent with diverticulitis. No abscess. Unchanged appearance of borderline enlarged lymph nodes in the celiac axis and retroperitoneum, likely reactive.     Dg Abd Acute W/chest 06/14/2014 Negative abdominal radiographs.  No acute cardiopulmonary disease.     Medical Consultants:  None   Other Consultants:  None   IAnti-Infectives:   Cipro one time dose on admission. Flagyl 06/14/2014 --> Rocephin 06/15/2014 -->    Manson Passey, MD  [facility] Pager [phone]  If 7PM-7AM, please contact night-coverage [email or web address] Password [facility] 06/17/2014, 10:47 AM   LOS: 3 day    HPI/Subjective: No acute overnight events.  Objective: Filed Vitals:   06/16/14 0917 06/16/14 1355 06/16/14 2130 06/17/14 0522  BP: 149/77 126/68 121/70 116/63  Pulse:  115 85 94  Temp:  98.8 F (37.1 C) 99.4 F (37.4 C) 99.6 F (37.6 C)  TempSrc:  Oral Oral Oral  Resp:  Height:      Weight:      SpO2:  99% 94%  90%    Intake/Output Summary (Last 24 hours) at 06/17/14 1047 Last data filed at 06/17/14 1026  Gross per 24 hour  Intake   1150 ml  Output    100 ml  Net   1050 ml    Exam:   General:  Pt is alert, awake  Cardiovascular: RRR, S1, S2 appreciated  Respiratory: No wheezing, no rhonchi  Abdomen: Soft, nontender to palpation, appreciate bowel sounds  Extremities:  Pulses palpable, no swelling  Neuro: Nonfocal  Data Reviewed: Basic Metabolic Panel:  Recent Labs Lab 06/14/14 1300 06/15/14 1120  NA 134* 137  K 3.9 3.2*  CL 99 102  CO2 25 26  GLUCOSE 156* 148*  BUN 18 14  CREATININE 0.98 0.82  CALCIUM 9.0 8.9   Liver Function Tests:  Recent Labs Lab 06/14/14 1300 06/15/14 1120  AST 29 22  ALT 16 14  ALKPHOS 106 83  BILITOT 0.8 0.6  PROT 8.6* 7.3  ALBUMIN 4.2 3.3*    Recent Labs Lab 06/14/14 1300  LIPASE 28   No results for input(s): AMMONIA in the last 168 hours. CBC:  Recent Labs Lab 06/14/14 1300 06/15/14 1120  WBC 14.5* 13.4*  NEUTROABS 13.0* 11.6*  HGB 16.9* 15.0  HCT 51.3* 45.3  MCV 80.5 81.0  PLT 249 255   Cardiac Enzymes: No results for input(s): CKTOTAL, CKMB, CKMBINDEX, TROPONINI in the last 168 hours. BNP: Invalid input(s): POCBNP CBG: No results for input(s): GLUCAP in the last 168 hours.  Recent Results (from the past 240 hour(s))  Urine culture     Status: None   Collection Time: 06/14/14 10:05 PM  Result Value Ref Range Status   Specimen Description URINE, CLEAN CATCH  Final   Special Requests NONE  Final   Colony Count   Final    >=100,000 COLONIES/ML [facility]formed at [facility]    Culture   Final    Multiple bacterial morphotypes present, none predominant. Suggest appropriate recollection if clinically indicated. Performed at [facility]    Report Status 06/16/2014 FINAL  Final  Culture, blood (routine x 2)     Status: None (Preliminary result)   Collection Time: 06/15/14  8:10 AM  Result Value Ref Range Status   Specimen Description BLOOD LEFT ARM  Final   Special Requests BOTTLES DRAWN AEROBIC ONLY 5CC  Final   Culture   Final           BLOOD CULTURE RECEIVED NO GROWTH TO DATE CULTURE WILL BE HELD FOR 5 DAYS BEFORE ISSUING A FINAL NEGATIVE REPORT Performed at [facility]    Report Status PENDING  Incomplete  Culture, blood (routine x 2)     Status: None  (Preliminary result)   Collection Time: 06/15/14  8:20 AM  Result Value Ref Range Status   Specimen Description BLOOD LEFT ARM  Final   Special Requests BOTTLES DRAWN AEROBIC AND ANAEROBIC 10CC  Final   Culture   Final           BLOOD CULTURE RECEIVED NO GROWTH TO DATE CULTURE WILL BE HELD FOR 5 DAYS BEFORE ISSUING A FINAL NEGATIVE REPORT Performed at [facility]    Report Status PENDING  Incomplete  Clostridium Difficile by PCR     Status: Abnormal   Collection Time: 06/16/14  2:06 PM  Result Value Ref Range Status   C difficile by pcr POSITIVE (A) NEGATIVE Final    Comment: CRITICAL RESULT CALLED TO, READ BACK BY AND VERIFIED WITH:  N. [facility] RN 17:15 06/16/14 ([email or web address])      Scheduled Meds: . brinzolamide  1 drop Both Eyes BID  . cefTRIAXone (ROCEPHIN)  IV  1 g Intravenous QHS  . FLUoxetine  60 mg Oral Daily  . metronidazole  500 mg Intravenous Q8H  . pantoprazole  40 mg Oral Daily  . verapamil  180 mg Oral Daily   Continuous Infusions:

## 2014-06-18 ENCOUNTER — Inpatient Hospital Stay (HOSPITAL_COMMUNITY): Payer: Medicare Other

## 2014-06-18 LAB — BASIC METABOLIC PANEL
Anion gap: 8 (ref 5–15)
BUN: 16 mg/dL (ref 6–23)
CHLORIDE: 105 mmol/L (ref 96–112)
CO2: 30 mmol/L (ref 19–32)
Calcium: 8.7 mg/dL (ref 8.4–10.5)
Creatinine, Ser: 0.91 mg/dL (ref 0.50–1.10)
GFR, EST AFRICAN AMERICAN: 75 mL/min — AB (ref >90)
GFR, EST NON AFRICAN AMERICAN: 65 mL/min — AB (ref >90)
GLUCOSE: 113 mg/dL — AB (ref 70–99)
Potassium: 2.9 mmol/L — ABNORMAL LOW (ref 3.5–5.1)
SODIUM: 143 mmol/L (ref 135–145)

## 2014-06-18 LAB — CBC
HCT: 42.1 % (ref 36.0–46.0)
Hemoglobin: 13.9 g/dL (ref 12.0–15.0)
MCH: 26.8 pg (ref 26.0–34.0)
MCHC: 33 g/dL (ref 30.0–36.0)
MCV: 81.3 fL (ref 78.0–100.0)
Platelets: 218 10*3/uL (ref 150–400)
RBC: 5.18 MIL/uL — ABNORMAL HIGH (ref 3.87–5.11)
RDW: 13.2 % (ref 11.5–15.5)
WBC: 6.9 10*3/uL (ref 4.0–10.5)

## 2014-06-18 MED ORDER — SACCHAROMYCES BOULARDII 250 MG PO CAPS
250.0000 mg | ORAL_CAPSULE | Freq: Two times a day (BID) | ORAL | Status: DC
  Administered 2014-06-18 – 2014-06-23 (×9): 250 mg via ORAL
  Filled 2014-06-18 (×12): qty 1

## 2014-06-18 MED ORDER — POTASSIUM CHLORIDE CRYS ER 20 MEQ PO TBCR
30.0000 meq | EXTENDED_RELEASE_TABLET | ORAL | Status: AC
  Administered 2014-06-18 – 2014-06-19 (×2): 30 meq via ORAL
  Filled 2014-06-18 (×2): qty 1

## 2014-06-18 MED ORDER — CIPROFLOXACIN HCL 500 MG PO TABS
500.0000 mg | ORAL_TABLET | Freq: Two times a day (BID) | ORAL | Status: DC
  Administered 2014-06-18 – 2014-06-23 (×9): 500 mg via ORAL
  Filled 2014-06-18 (×14): qty 1

## 2014-06-18 MED ORDER — METRONIDAZOLE 500 MG PO TABS
500.0000 mg | ORAL_TABLET | Freq: Three times a day (TID) | ORAL | Status: DC
  Administered 2014-06-18 – 2014-06-19 (×4): 500 mg via ORAL
  Filled 2014-06-18 (×6): qty 1

## 2014-06-18 NOTE — Progress Notes (Addendum)
PROGRESS NOTE    Deanna Allison ZOX:[id number] DOB: Jun 21, 1950 DOA: 06/14/2014 PCP: Angelica Chessman., MD  Primary GI: Dr. 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 She was advised to go to the ER and went to the [facility] ED but due to long wait time, presented to [facility] [place]. CT abdomen and pelvis in the ED showed sigmoid diverticulitis. Subsequent C. difficile PCR was positive.   Assessment/Plan:  1. Acute sigmoid diverticulitis: Treating empirically with IV Flagyl and ceftriaxone. As discussed with infectious disease M.D. on call on 06/18/14, plan to transition to oral Flagyl and Cipro for 2 weeks and then continue Flagyl for an additional week thereafter. Improving. Patient can follow-up with her primary GI to reschedule screening colonoscopy after acute events have settled. Discussed with patient in a.m. re need for PPI-and if no clear or absolute indication then DC. Start probiotics. 2. C. difficile diarrhea: Antimicrobial management as above. No recent antibiotic exposure. 3. Hypertension: Controlled. Continue verapamil. 4. Sleep apnea: Continue nightly C Pap 5. Hypokalemia: Check BMP and replace as needed. 6. History of pulmonary sarcoidosis: Intermittently on prednisone but not recently. 7. History of von Willebrand's disease: No bleeding reported. 8. History of kidney stones: CT on admission showed probable tiny punctate stone in the distal right ureter with mild proximal obstruction. Patient's current abdominal pain likely not from this. Discussed with urologist on call on 3/12 and recommended obtaining a renal  ultrasound.    Code Status: Full Family Communication: None at bedside Disposition Plan: Home possibly in 1-2 days.   Consultants:  None  Procedures:  None  Antibiotics:  IV Rocephin 3/8 > 3/12  IV Flagyl 3/8-3/11  PO Flagyl 3/12 >  PO Cipro 3/12 >  Subjective: Overall feels much better compared to admission. Intermittent cramping/sharp abdominal pain which last a few minutes and resolve spontaneously. Diarrhea has decreased and has up to 3-4 loose/watery BMs daily without blood. Tolerating diet without nausea or vomiting.  Objective: Filed Vitals:   06/17/14 1547 06/17/14 2057 06/18/14 0538 06/18/14 1413  BP: 125/57 124/56 145/76 126/52  Pulse: 82 71 85   Temp: 99.5 F (37.5 C) 98.7 F (37.1 C) 98.9 F (37.2 C) 99.2 F (37.3 C)  TempSrc: Oral Oral Oral Oral  Resp: Height:      Weight:      SpO2: 95% 95% 100% 93%    Intake/Output Summary (Last 24 hours) at 06/18/14 1551 Last data filed at 06/18/14 1412  Gross per 24 hour  Intake    850 ml  Output    750 ml  Net    100 ml   Filed Weights   06/14/14 1232 06/15/14 0827  Weight: 89.359 kg (197 lb) 85.9 kg (189 lb 6 oz)     Exam:  General exam: Pleasant middle-aged female sitting up comfortably at edge of bed this morning. Respiratory system: Clear. No increased work of breathing. Cardiovascular system: S1 & S2 heard, RRR. No JVD, murmurs, gallops, clicks or pedal edema. Gastrointestinal system: Abdomen is nondistended, soft and nontender. Normal bowel sounds heard. Central nervous system: Alert and oriented. No focal neurological deficits. Extremities: Symmetric 5 x 5 power.  Data Reviewed: Basic Metabolic Panel:  Recent Labs Lab 06/14/14 1300 06/15/14 1120  NA 134* 137  K 3.9 3.2*  CL 99 102  CO2 25 26  GLUCOSE 156* 148*  BUN 18 14  CREATININE 0.98 0.82  CALCIUM 9.0 8.9   Liver Function Tests:  Recent Labs Lab 06/14/14 1300 06/15/14 1120  AST 29 22  ALT 16 14    ALKPHOS 106 83  BILITOT 0.8 0.6  PROT 8.6* 7.3  ALBUMIN 4.2 3.3*    Recent Labs Lab 06/14/14 1300  LIPASE 28   No results for input(s): AMMONIA in the last 168 hours. CBC:  Recent Labs Lab 06/14/14 1300 06/15/14 1120  WBC 14.5* 13.4*  NEUTROABS 13.0* 11.6*  HGB 16.9* 15.0  HCT 51.3* 45.3  MCV 80.5 81.0  PLT 249 255   Cardiac Enzymes: No results for input(s): CKTOTAL, CKMB, CKMBINDEX, TROPONINI in the last 168 hours. BNP (last 3 results) No results for input(s): PROBNP in the last 8760 hours. CBG: No results for input(s): GLUCAP in the last 168 hours.  Recent Results (from the past 240 hour(s))  Urine culture     Status: None   Collection Time: 06/14/14 10:05 PM  Result Value Ref Range Status   Specimen Description URINE, CLEAN CATCH  Final   Special Requests NONE  Final   Colony Count   Final    >=100,000 COLONIES/ML Performed at [facility]    Culture   Final    Multiple bacterial morphotypes present, none predominant. Suggest appropriate recollection if clinically indicated. Performed at [facility]    Report Status 06/16/2014 FINAL  Final  Culture, blood (routine x 2)     Status: None (Preliminary result)   Collection Time: 06/15/14  8:10 AM  Result Value Ref Range Status   Specimen Description BLOOD LEFT ARM  Final   Special Requests BOTTLES DRAWN AEROBIC ONLY 5CC  Final   Culture   Final           BLOOD CULTURE RECEIVED NO GROWTH TO DATE CULTURE WILL BE HELD FOR 5 DAYS BEFORE ISSUING A FINAL NEGATIVE REPORT Performed at [facility]    Report Status PENDING  Incomplete  Culture, blood (routine x 2)     Status: None (Preliminary result)   Collection Time: 06/15/14  8:20 AM  Result Value Ref Range Status   Specimen Description BLOOD LEFT ARM  Final   Special Requests BOTTLES DRAWN AEROBIC AND ANAEROBIC 10CC  Final   Culture   Final           BLOOD CULTURE RECEIVED NO GROWTH TO DATE CULTURE WILL BE HELD FOR 5 DAYS BEFORE  ISSUING A FINAL NEGATIVE REPORT Performed at [facility]    Report Status PENDING  Incomplete  Clostridium Difficile by PCR     Status: Abnormal   Collection Time: 06/16/14  2:06 PM  Result Value Ref Range Status   C difficile by pcr POSITIVE (A) NEGATIVE Final    Comment: CRITICAL RESULT CALLED TO, READ BACK BY AND VERIFIED WITH: Alexia FreestoneN. WINDHAM RN 17:15 06/16/14 ([email or web address])            Studies: No results found.      Scheduled Meds: . brinzolamide  1 drop Both Eyes BID  . cefTRIAXone (ROCEPHIN)  IV  1 g Intravenous QHS  . FLUoxetine  60 mg Oral Daily  . metroNIDAZOLE  500 mg Oral TID  . pantoprazole  40 mg Oral Daily  . verapamil  180 mg Oral Daily   Continuous Infusions:   Active Problems:   Diverticulitis   Acute diverticulitis   Nausea vomiting and diarrhea   Essential hypertension   Sleep apnea   Sigmoid diverticulitis   Right kidney stone    Time spent: 30 minutes.    Marcellus Scott, MD, FACP, FHM. [facility] Pager [phone]  If 7PM-7AM, please contact night-coverage [email or web address] Password [facility] 06/18/2014, 3:51 PM    LOS: 3 days

## 2014-06-18 NOTE — Progress Notes (Signed)
Pt has home CPAP and places herself on and off she states she does not need further assistance

## 2014-06-19 DIAGNOSIS — E876 Hypokalemia: Secondary | ICD-10-CM

## 2014-06-19 LAB — BASIC METABOLIC PANEL
Anion gap: 9 (ref 5–15)
BUN: 12 mg/dL (ref 6–23)
CO2: 29 mmol/L (ref 19–32)
Calcium: 8.9 mg/dL (ref 8.4–10.5)
Chloride: 106 mmol/L (ref 96–112)
Creatinine, Ser: 0.82 mg/dL (ref 0.50–1.10)
GFR calc non Af Amer: 73 mL/min — ABNORMAL LOW (ref >90)
GFR, EST AFRICAN AMERICAN: 85 mL/min — AB (ref >90)
Glucose, Bld: 88 mg/dL (ref 70–99)
Potassium: 3.3 mmol/L — ABNORMAL LOW (ref 3.5–5.1)
Sodium: 144 mmol/L (ref 135–145)

## 2014-06-19 LAB — MAGNESIUM: MAGNESIUM: 1.7 mg/dL (ref 1.5–2.5)

## 2014-06-19 MED ORDER — POTASSIUM CHLORIDE IN NACL 40-0.9 MEQ/L-% IV SOLN
INTRAVENOUS | Status: AC
  Administered 2014-06-19: 100 mL/h via INTRAVENOUS
  Filled 2014-06-19 (×2): qty 1000

## 2014-06-19 MED ORDER — POTASSIUM CHLORIDE CRYS ER 20 MEQ PO TBCR
30.0000 meq | EXTENDED_RELEASE_TABLET | ORAL | Status: DC
  Filled 2014-06-19 (×2): qty 1

## 2014-06-19 MED ORDER — PROMETHAZINE HCL 25 MG/ML IJ SOLN
12.5000 mg | Freq: Four times a day (QID) | INTRAMUSCULAR | Status: DC | PRN
  Administered 2014-06-20 – 2014-06-21 (×2): 12.5 mg via INTRAVENOUS
  Filled 2014-06-19 (×3): qty 1

## 2014-06-19 MED ORDER — POTASSIUM CHLORIDE 10 MEQ/100ML IV SOLN
10.0000 meq | INTRAVENOUS | Status: AC
  Administered 2014-06-19 (×4): 10 meq via INTRAVENOUS
  Filled 2014-06-19 (×4): qty 100

## 2014-06-19 MED ORDER — VANCOMYCIN 50 MG/ML ORAL SOLUTION
125.0000 mg | Freq: Four times a day (QID) | ORAL | Status: DC
  Administered 2014-06-19 – 2014-06-20 (×5): 125 mg via ORAL
  Filled 2014-06-19 (×8): qty 2.5

## 2014-06-19 NOTE — Progress Notes (Addendum)
Addendum  Patient has been having multiple episodes of non bloody emesis and 4-5 episodes of pasty/watery stools without blood. She complains of "heart racing" but no chest pain or SOB. Changed PO Flagyl to PO Vancomycin. EKG: SR with out EKG changes. Prolonged QTC: 504 msec may be due to hypokalemia > replacing. Check Mg. Place on telemetry. Start brief IVF.  Patient appears non toxic and in no distress. RS- remains clear, CVS: S1S2 RRR, Abd: Non distended, non tender, soft and normal bowel sounds heard. CNS: Alert & oriented.  Discussed with spouse at bedside.  Marcellus ScottHONGALGI,Deanna Seyer, MD, FACP, FHM. [facility] Pager [phone]  If 7PM-7AM, please contact night-coverage [email or web address] Password [email or web address] 06/19/2014, 5:47 PM

## 2014-06-19 NOTE — Progress Notes (Signed)
Pt had BP 165/83 mm of hg. Prn Iv Hydralazine 5 mg given. Rechecked bp is 170/72 mm of hg and HR is  120/min. Pt denies pain and c/o occasional palpitations. On-call provider Jennye BoroughsS Osman, PA notified. Awaiting on orders.will cont to monitor.

## 2014-06-19 NOTE — Progress Notes (Signed)
Patient comtinues to complain of n/v at this time. MD Hongalgi notified. Orders received.

## 2014-06-19 NOTE — Progress Notes (Addendum)
Patient reports "it feels like my heart is pounding out of my chest" BP 169/92 Pulse 84. Obtaining 12 lead EKG. MD Hongalgi notified. Orders received. Will continue to monitor.

## 2014-06-19 NOTE — Progress Notes (Addendum)
PROGRESS NOTE    Deanna Allison ZOX:[id number] DOB: October 24, 1950 DOA: 06/14/2014 PCP: Angelica Chessman., MD  Primary GI: Dr. 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 She was advised to go to the ER and went to the [facility] ED but due to long wait time, presented to [facility] [place]. CT abdomen and pelvis in the ED showed sigmoid diverticulitis. Subsequent C. difficile PCR was positive.   Assessment/Plan:  1. Acute sigmoid diverticulitis: Treating empirically with IV Flagyl and ceftriaxone. As discussed with infectious disease M.D. on call on 06/18/14, plan to transition to oral Flagyl and Cipro for 2 weeks and then continue Flagyl for an additional week thereafter. Improving. Patient can follow-up with her primary GI to reschedule screening colonoscopy after acute events have settled. Discuss with patient in a.m. re need for PPI-and if no clear or absolute indication then DC. Start probiotics. Patient had couple of episodes of emesis which may be related to oral Flagyl-DC and trial of PO vancomycin. 2. C. difficile diarrhea: Antimicrobial management as above. No recent antibiotic exposure. 3. Hypertension: Controlled. Continue verapamil. 4. Sleep apnea: Continue nightly C Pap 5. Hypokalemia: Better compared to last night. Tolerated single dose of oral potassium this morning. Replace rest IV and follow BMP in a.m. 6. History of pulmonary sarcoidosis: Intermittently on prednisone but not recently. 7. History of von Willebrand's disease: No bleeding reported. 8. History of kidney stones: CT on admission showed probable tiny  punctate stone in the distal right ureter with mild proximal obstruction. Patient's current abdominal pain likely not from this. Discussed with urologist on call on 3/12 and recommended obtaining a renal ultrasound. Renal ultrasound without hydroureteronephrosis.    Code Status: Full Family Communication:  Discussed with spouse - updated care and answered questions 06/19/2014. Disposition Plan: Home possibly in a.m if able to tolerate by mouth without vomiting.   Consultants:  None  Procedures:  None  Antibiotics:  IV Rocephin 3/8 > 3/12  IV Flagyl 3/8-3/11  PO Flagyl 3/12 >3/13  PO Cipro 3/12 >  PO Vanc 3/13>  Subjective: Diarrhea much improved. Had 2 BMs in the last 24 hours and consistencies more formed. He had couple of episodes of nonbloody emesis since morning. Abdominal pain improving.  Objective: Filed Vitals:   06/18/14 1413 06/18/14 2126 06/19/14 0528 06/19/14 0642  BP: 126/52 141/69 175/92 151/68  Pulse:  74 83   Temp: 99.2 F (37.3 C) 98.5 F (36.9 C) 98.7 F (37.1 C)   TempSrc: Oral Oral Oral   Resp: Height:      Weight:      SpO2: 93% 95% 97%     Intake/Output Summary (Last 24 hours) at 06/19/14 1418 Last data filed at 06/19/14 1053  Gross per 24 hour  Intake    710 ml  Output    350 ml  Net    360 ml   Filed Weights   06/14/14 1232 06/15/14 0827  Weight: 89.359 kg (197 lb) 85.9 kg (189 lb 6 oz)     Exam:  General exam: Pleasant middle-aged female sitting up comfortably at edge of bed this morning. Respiratory system: Clear. No increased work of breathing. Cardiovascular  system: S1 & S2 heard, RRR. No JVD, murmurs, gallops, clicks or pedal edema. Gastrointestinal system: Abdomen is nondistended, soft and nontender. Normal bowel sounds heard. Central nervous system: Alert and oriented. No focal neurological deficits. Extremities: Symmetric 5 x 5 power.   Data Reviewed: Basic Metabolic Panel:  Recent Labs Lab  06/14/14 1300 06/15/14 1120 06/18/14 1640 06/19/14 0628  NA 134* 137 143 144  K 3.9 3.2* 2.9* 3.3*  CL 99 102 105 106  CO2 GLUCOSE 156* 148* 113* 88  BUN CREATININE 0.98 0.82 0.91 0.82  CALCIUM 9.0 8.9 8.7 8.9   Liver Function Tests:  Recent Labs Lab 06/14/14 1300 06/15/14 1120  AST 29 22  ALT 16 14  ALKPHOS 106 83  BILITOT 0.8 0.6  PROT 8.6* 7.3  ALBUMIN 4.2 3.3*    Recent Labs Lab 06/14/14 1300  LIPASE 28   No results for input(s): AMMONIA in the last 168 hours. CBC:  Recent Labs Lab 06/14/14 1300 06/15/14 1120 06/18/14 1640  WBC 14.5* 13.4* 6.9  NEUTROABS 13.0* 11.6*  --   HGB 16.9* 15.0 13.9  HCT 51.3* 45.3 42.1  MCV 80.5 81.0 81.3  PLT 249 255 218   Cardiac Enzymes: No results for input(s): CKTOTAL, CKMB, CKMBINDEX, TROPONINI in the last 168 hours. BNP (last 3 results) No results for input(s): PROBNP in the last 8760 hours. CBG: No results for input(s): GLUCAP in the last 168 hours.  Recent Results (from the past 240 hour(s))  Urine culture     Status: None   Collection Time: 06/14/14 10:05 PM  Result Value Ref Range Status   Specimen Description URINE, CLEAN CATCH  Final   Special Requests NONE  Final   Colony Count   Final    >=100,000 COLONIES/ML Performed at [facility]    Culture   Final    Multiple bacterial morphotypes present, none predominant. Suggest appropriate recollection if clinically indicated. Performed at [facility]    Report Status 06/16/2014 FINAL  Final  Culture, blood (routine x 2)     Status: None (Preliminary result)   Collection Time: 06/15/14  8:10 AM  Result Value Ref Range Status   Specimen Description BLOOD LEFT ARM  Final   Special Requests BOTTLES DRAWN AEROBIC ONLY 5CC  Final   Culture   Final           BLOOD CULTURE RECEIVED NO GROWTH TO DATE CULTURE WILL BE HELD FOR 5 DAYS BEFORE ISSUING A FINAL NEGATIVE REPORT Performed at [facility]    Report  Status PENDING  Incomplete  Culture, blood (routine x 2)     Status: None (Preliminary result)   Collection Time: 06/15/14  8:20 AM  Result Value Ref Range Status   Specimen Description BLOOD LEFT ARM  Final   Special Requests BOTTLES DRAWN AEROBIC AND ANAEROBIC 10CC  Final   Culture   Final           BLOOD CULTURE RECEIVED NO GROWTH TO DATE CULTURE WILL BE HELD FOR 5 DAYS BEFORE ISSUING A FINAL NEGATIVE REPORT Performed at [facility]    Report Status PENDING  Incomplete  Stool culture     Status: None (Preliminary result)   Collection Time: 06/16/14  2:06 PM  Result Value Ref Range Status   Specimen Description STOOL  Final   Special Requests NONE  Final   Culture   Final    Culture reincubated  for better growth Performed at [facility]    Report Status PENDING  Incomplete  Clostridium Difficile by PCR     Status: Abnormal   Collection Time: 06/16/14  2:06 PM  Result Value Ref Range Status   C difficile by pcr POSITIVE (A) NEGATIVE Final    Comment: CRITICAL RESULT CALLED TO, READ BACK BY AND VERIFIED WITH: Alexia FreestoneN. WINDHAM RN 17:15 06/16/14 ([email or web address])            Studies: [place] Renal  06/18/2014   CLINICAL DATA:  64 year old female with potential distal right ureteral stone identified on CT scan from 06/14/2014. Evaluate for hydronephrosis.  EXAM: RENAL/URINARY TRACT ULTRASOUND COMPLETE  COMPARISON:  CT of the abdomen and pelvis 06/14/2014.  FINDINGS: Examination limited by the patient's large body habitus.  Right Kidney:  Length: 9.4 cm. Echogenicity within normal limits. No mass or hydronephrosis visualized.  Left Kidney:  Length: 10.2 cm. Echogenicity within normal limits. No mass or hydronephrosis visualized.  Bladder:  Appears normal for degree of bladder distention.  IMPRESSION: 1. No hydroureteronephrosis. 2. Limited examination secondary to the patient's large body habitus. With these limitations in mind, the appearance of the kidneys and urinary bladder is  normal.   Electronically Signed   By: Trudie Reedaniel  Entrikin M.D.   On: 06/18/2014 17:26        Scheduled Meds: . brinzolamide  1 drop Both Eyes BID  . ciprofloxacin  500 mg Oral BID  . FLUoxetine  60 mg Oral Daily  . pantoprazole  40 mg Oral Daily  . potassium chloride  10 mEq Intravenous Q1 Hr x 4  . saccharomyces boulardii  250 mg Oral BID  . vancomycin  125 mg Oral 4 times per day  . verapamil  180 mg Oral Daily   Continuous Infusions:   Active Problems:   Diverticulitis   Acute diverticulitis   Nausea vomiting and diarrhea   Essential hypertension   Sleep apnea   Sigmoid diverticulitis   Right kidney stone    Time spent: 30 minutes.    Marcellus ScottHONGALGI,ANAND, MD, FACP, FHM. [facility] Pager [phone]  If 7PM-7AM, please contact night-coverage [email or web address] Password [email or web address] 06/19/2014, 2:18 PM    LOS: 4 days

## 2014-06-20 LAB — BASIC METABOLIC PANEL
Anion gap: 9 (ref 5–15)
BUN: 10 mg/dL (ref 6–23)
CO2: 25 mmol/L (ref 19–32)
Calcium: 9.1 mg/dL (ref 8.4–10.5)
Chloride: 105 mmol/L (ref 96–112)
Creatinine, Ser: 0.76 mg/dL (ref 0.50–1.10)
GFR calc Af Amer: >90 mL/min (ref >90)
GFR calc non Af Amer: 87 mL/min — ABNORMAL LOW (ref >90)
Glucose, Bld: 108 mg/dL — ABNORMAL HIGH (ref 70–99)
POTASSIUM: 4.1 mmol/L (ref 3.5–5.1)
SODIUM: 139 mmol/L (ref 135–145)

## 2014-06-20 LAB — STOOL CULTURE

## 2014-06-20 LAB — MAGNESIUM: Magnesium: 1.8 mg/dL (ref 1.5–2.5)

## 2014-06-20 MED ORDER — FAMOTIDINE 20 MG PO TABS
20.0000 mg | ORAL_TABLET | Freq: Two times a day (BID) | ORAL | Status: DC
  Administered 2014-06-20 – 2014-06-23 (×6): 20 mg via ORAL
  Filled 2014-06-20 (×8): qty 1

## 2014-06-20 MED ORDER — METOPROLOL TARTRATE 1 MG/ML IV SOLN
5.0000 mg | Freq: Once | INTRAVENOUS | Status: AC
  Administered 2014-06-20: 5 mg via INTRAVENOUS
  Filled 2014-06-20: qty 5

## 2014-06-20 MED ORDER — HYDRALAZINE HCL 20 MG/ML IJ SOLN
5.0000 mg | Freq: Once | INTRAMUSCULAR | Status: AC
  Administered 2014-06-20: 5 mg via INTRAVENOUS
  Filled 2014-06-20: qty 1

## 2014-06-20 MED ORDER — METRONIDAZOLE IN NACL 5-0.79 MG/ML-% IV SOLN
500.0000 mg | Freq: Three times a day (TID) | INTRAVENOUS | Status: DC
  Administered 2014-06-20 – 2014-06-21 (×2): 500 mg via INTRAVENOUS
  Filled 2014-06-20 (×5): qty 100

## 2014-06-20 MED ORDER — HYDRALAZINE HCL 20 MG/ML IJ SOLN: 10.0000 mg | Freq: Four times a day (QID) | INTRAMUSCULAR | Status: DC | PRN

## 2014-06-20 MED ORDER — POTASSIUM CHLORIDE IN NACL 40-0.9 MEQ/L-% IV SOLN
INTRAVENOUS | Status: DC
  Administered 2014-06-20: 75 mL/h via INTRAVENOUS
  Filled 2014-06-20 (×2): qty 1000

## 2014-06-20 NOTE — Consult Note (Signed)
Referring Provider: No ref. provider found Primary Care Physician:  Angelica Chessman., MD Primary Gastroenterologist:  Dr. Karena Addison in [facility]  Reason for Consultation:  Nausea, vomiting, abdominal pain, and diarrhea; diverticulitis, Cdiff  HPI: Deanna Allison is a 64 y.o. female with PMH of hypertension, sleep apnea on CPAP, pulmonary sarcoidosis, Von Willebrand's disease, and fibromyalgia who presented to the ER on 3/8 because of sudden onset nausea, vomiting, and abdominal pain while drinking a bowel prep for a virtual colonoscopy.  She apparently had a failed/incomplete colonoscopy, which is why she was to have a virtual colonoscopy.  Pain was located in her mid-abdomen.  In the ER CT abdomen and pelvis with contrast showed diverticulitis of the sigmoid colon (results listed below).  Due to "diarrhea" she was tested for Cdiff, which was positive.  CT scan of the abdomen and pelvis with contrast showed the following:  "IMPRESSION: Probable tiny punctate stone in the distal right ureter with mild proximal obstruction. Nonspecific free fluid in the right pelvis over the iliopsoas region. Diverticulosis of sigmoid colon with inflammatory changes consistent with diverticulitis. No abscess. Unchanged appearance of borderline enlarged lymph nodes in the celiac axis and retroperitoneum, likely reactive."  Despite antibiotics she has continued to have pain, diarrhea, and daily nausea with episodic vomiting.  Has low grade temp and is slightly tachy but no leukocytosis.  Apparently she was initially started on IV Flagyl and ceftriaxone, but then the case was discussed with infectious disease and she was transitioned to oral Flagyl and Cipro.  Due to ongoing vomiting the Flagyl was then changed to the PO vancomycin.    Past Medical History  Diagnosis Date  . Pulmonary sarcoidosis   . Fibromyalgia   . Heart valve disorder     trace AR, mild PR, mild to mod MR/TR, EF 60% 09/22/12 echo  ([facility])  . Glaucoma   . Von Willebrand disease   . Diabetes mellitus without complication   . Cataracts, bilateral   . Diverticulitis   . Kidney stone   . PONV (postoperative nausea and vomiting)   . Hypertension     See [facility] at [facility]  . OSA (obstructive sleep apnea)     uses CPAP sleep study > 3 years ago  . Hearing loss of both ears     Past Surgical History  Procedure Laterality Date  . Cholecystectomy    . Appendectomy    . Abdominal hysterectomy    . Hernia repair    . Tibia fracture surgery    . Hand surgery    . Lung biopsy    . Multiple extractions with alveoloplasty N/A 03/18/2013    Procedure: MULTIPLE SURGICAL TEETH EXTRACTIONS;  Surgeon: Hinton Dyer, DDS;  Location: [facility] OR;  Service: Oral Surgery;  Laterality: N/A;    Prior to Admission medications   Medication Sig Start Date End Date Taking? Authorizing Provider  brinzolamide (AZOPT) 1 % ophthalmic suspension Place 1 drop into both eyes 2 (two) times daily.    Yes Historical Provider, MD  FLUoxetine HCl 60 MG TABS Take 60 mg by mouth daily.   Yes Historical Provider, MD  omeprazole (PRILOSEC) 40 MG capsule Take 40 mg by mouth 2 (two) times daily.   Yes Historical Provider, MD  oxycodone (ROXICODONE) 30 MG immediate release tablet Take 30 mg by mouth every 6 (six) hours as needed for pain.   Yes Historical Provider, MD  verapamil (CALAN-SR) 180 MG CR tablet Take 180 mg by mouth daily.  Yes Historical Provider, MD  zolpidem (AMBIEN) 5 MG tablet Take 2.5-5 mg by mouth at bedtime as needed for sleep.    Yes Historical Provider, MD    Current Facility-Administered Medications  Medication Dose Route Frequency Provider Last Rate Last Dose  . acetaminophen (TYLENOL) tablet 650 mg  650 mg Oral Q6H PRN Eduard ClosArshad N Kakrakandy, MD   650 mg at 06/15/14 0524   Or  . acetaminophen (TYLENOL) suppository 650 mg  650 mg Rectal Q6H PRN Eduard ClosArshad N Kakrakandy, MD      . brinzolamide (AZOPT) 1 % ophthalmic  suspension 1 drop  1 drop Both Eyes BID Eduard ClosArshad N Kakrakandy, MD   1 drop at 06/20/14 1010  . ciprofloxacin (CIPRO) tablet 500 mg  500 mg Oral BID Elease EtienneAnand D Hongalgi, MD   500 mg at 06/20/14 0908  . FLUoxetine (PROZAC) capsule 60 mg  60 mg Oral Daily Eduard ClosArshad N Kakrakandy, MD   60 mg at 06/20/14 1010  . hydrALAZINE (APRESOLINE) injection 5 mg  5 mg Intravenous Q6H PRN Alison MurrayAlma M Devine, MD   5 mg at 06/19/14 2216  . HYDROcodone-acetaminophen (NORCO/VICODIN) 5-325 MG per tablet 1 tablet  1 tablet Oral Q4H PRN Alison MurrayAlma M Devine, MD   1 tablet at 06/18/14 0933  . HYDROmorphone (DILAUDID) injection 0.5 mg  0.5 mg Intravenous Q4H PRN Alison MurrayAlma M Devine, MD   0.5 mg at 06/20/14 0429  . ondansetron (ZOFRAN) injection 4 mg  4 mg Intravenous Q6H PRN Eduard ClosArshad N Kakrakandy, MD   4 mg at 06/20/14 0908  . pantoprazole (PROTONIX) EC tablet 40 mg  40 mg Oral Daily Eduard ClosArshad N Kakrakandy, MD   40 mg at 06/20/14 1010  . promethazine (PHENERGAN) injection 12.5 mg  12.5 mg Intravenous Q6H PRN Elease EtienneAnand D Hongalgi, MD   12.5 mg at 06/20/14 1257  . saccharomyces boulardii (FLORASTOR) capsule 250 mg  250 mg Oral BID Elease EtienneAnand D Hongalgi, MD   250 mg at 06/20/14 1011  . tiZANidine (ZANAFLEX) tablet 2 mg  2 mg Oral Q6H PRN Eduard ClosArshad N Kakrakandy, MD      . vancomycin (VANCOCIN) 50 mg/mL oral solution 125 mg  125 mg Oral 4 times per day Elease EtienneAnand D Hongalgi, MD   125 mg at 06/20/14 1247  . verapamil (CALAN-SR) CR tablet 180 mg  180 mg Oral Daily Eduard ClosArshad N Kakrakandy, MD   180 mg at 06/20/14 1010  . zolpidem (AMBIEN) tablet 2.5-5 mg  2.5-5 mg Oral QHS PRN Eduard ClosArshad N Kakrakandy, MD   5 mg at 06/19/14 0126    Allergies as of 06/14/2014 - Review Complete 06/14/2014  Allergen Reaction Noted  . Benadryl [diphenhydramine hcl (sleep)]  06/14/2014  . Cymbalta [duloxetine hcl] Nausea And Vomiting 09/07/2012  . Iodine Nausea And Vomiting 01/08/2013  . Ivp dye [iodinated diagnostic agents]  09/07/2012  . Lyrica [pregabalin]  09/07/2012  . Nsaids  01/08/2013  .  Penicillins Hives 09/07/2012  . Nickel Rash 09/07/2012    History reviewed. No pertinent family history.  History   Social History  . Marital Status: Married    Spouse Name: N/A  . Number of Children: N/A  . Years of Education: N/A   Occupational History  . Not on file.   Social History Main Topics  . Smoking status: Former Smoker -- 0.50 packs/day for 16 years    Quit date: 04/08/1976  . Smokeless tobacco: Not on file  . Alcohol Use: No  . Drug Use: Yes    Special: Marijuana  .  Sexual Activity: Not on file   Other Topics Concern  . Not on file   Social History Narrative    Review of Systems: Ten point ROS is O/W negative except as mentioned in HPI.  Physical Exam: Vital signs in last 24 hours: Temp:  [99.3 F (37.4 C)-99.8 F (37.7 C)] 99.3 F (37.4 C) (03/14 0617) Pulse Rate:  [84-110] 110 (03/14 0617) Resp:  [16-21] 17 (03/14 0617) BP: (147-174)/(51-92) 151/76 mmHg (03/14 0617) SpO2:  [96 %-99 %] 96 % (03/14 0617) Last BM Date: 06/19/14 General:  Alert, Well-developed, well-nourished, pleasant and cooperative in NAD Head:  Normocephalic and atraumatic. Eyes:  Sclera clear, no icterus.  Conjunctiva pink. Ears:  Normal auditory acuity. Mouth:  No deformity or lesions.   Lungs:  Clear throughout to auscultation.  No wheezes, crackles, or rhonchi.  Heart:  Slightly tachy, but regular. Abdomen:  Soft, non-distended.  BS present.  Mild diffuse TTP but actually mostly on the right and really no left sided TTP.   Rectal:  Deferred  Msk:  Symmetrical without gross deformities. Pulses:  Normal pulses noted. Extremities:  Without clubbing or edema. Neurologic:  Alert and  oriented x4;  grossly normal neurologically. Skin:  Intact without significant lesions or rashes. Psych:  Alert and cooperative. Normal mood and affect.  Intake/Output from previous day: 03/13 0701 - 03/14 0700 In: 1521.7 [P.O.:420; I.V.:1101.7] Out: 1060 [Urine:960; Emesis/NG  output:100] Intake/Output this shift: Total I/O In: 0  Out: 150 [Urine:150]  Lab Results:  Recent Labs  06/18/14 1640  WBC 6.9  HGB 13.9  HCT 42.1  PLT 218   BMET  Recent Labs  06/18/14 1640 06/19/14 0628 06/20/14 0633  NA 143 144 139  K 2.9* 3.3* 4.1  CL 105 106 105  CO2 GLUCOSE 113* 88 108*  BUN CREATININE 0.91 0.82 0.76  CALCIUM 8.7 8.9 9.1   Studies/Results: US Renal  06/18/2014   CLINICAL DATA:  64 year old female with potential distal right ureteral stone identified on CT scan from 06/14/2014. Evaluate for hydronephrosis.  EXAM: RENAL/URINARY TRACT ULTRASOUND COMPLETE  COMPARISON:  CT of the abdomen and pelvis 06/14/2014.  FINDINGS: Examination limited by the patient's large body habitus.  Right Kidney:  Length: 9.4 cm. Echogenicity within normal limits. No mass or hydronephrosis visualized.  Left Kidney:  Length: 10.2 cm. Echogenicity within normal limits. No mass or hydronephrosis visualized.  Bladder:  Appears normal for degree of bladder distention.  IMPRESSION: 1. No hydroureteronephrosis. 2. Limited examination secondary to the patient's large body habitus. With these limitations in mind, the appearance of the kidneys and urinary bladder is normal.   Electronically Signed   By: Trudie Reed M.D.   On: 06/18/2014 17:26    IMPRESSION:  -64 year old female with sudden onset nausea, vomiting, and mid-abdominal pain while drinking prep for virtual colonoscopy.  Was tested for Cdiff, which was positive and CT scan showed sigmoid diverticulitis.  Interestingly, she has very little left sided abdominal tenderness.  Continues with intermittent nausea, vomiting, mid-abdominal pain, diarrhea, and poor appetite.  Some of her continued symptoms, particularly the nausea, vomiting, and poor appetite, may be from the antibiotics themselves.  PLAN: -Currently on cipro 500 mg PO BID and vancomycin 125 mg PO every 6 hours. -On Florastor BID, which should be  continued. -Could consider adding an antispasmodic such as Bentyl TID. -I have changed her diet to soft/low fiber instead of regular diet. -Repeat CT scan tomorrow, 3/15,  if continues with abdominal pain.  ZEHR, JESSICA D.  06/20/2014, 1:13 PM  Pager number [phone]  GI ATTENDING  History, laboratories, x-rays reviewed. Agree with H&P as outlined above. Patient admitted with diverticulitis and C. difficile related diarrhea. Being treated for both. Asked to see for nausea and vomiting as well as poor appetite. Suspect multifactorial including acute illness and medications. Metronidazole most likely medication to cost symptom complex. Agree with antiemetics, changing to vancomycin, and considering CT scan tomorrow, as follow-up, should abdominal discomfort persists. We'll follow. Thank you  Wilhemina Bonito. Eda Keys., M.D. [facility] Division of Gastroenterology

## 2014-06-20 NOTE — Progress Notes (Signed)
Medicare Important Message given?  YES (If response is "NO", the following Medicare IM given date fields will be blank) Date Medicare IM given:06/20/14 Medicare IM given by:  Louis Gaw 

## 2014-06-20 NOTE — Progress Notes (Addendum)
Shift Event: Pt unable to tolerate PO Vancomycin due to N/V. Discussed with pharmacy- will place on IV flagyl since pt didn't tolerate oral flagyl.  Oral Deanna Allison d/c'd.  Illa LevelSahar Ubah [facility]

## 2014-06-20 NOTE — Progress Notes (Signed)
PROGRESS NOTE    Deanna Allison RUE:[id number] DOB: February 01, 1951 DOA: 06/14/2014 PCP: Angelica Chessman., MD  Primary GI: Dr. Catarina Hartshorn, in [place], [place]  HPI/Brief narrative Stay 64-year-old female with history of HTN, OSA on nightly C Pap, pulmonary sarcoidosis on intermittent prednisone, Von Willebrand's disease, kidney stones, family history of colon cancer, was undergoing prep for virtual colonoscopy (scheduled 06/13/14) when she started experiencing nausea, vomiting, abdominal pain and diarrhea. She was advised to go to the ER and went to the [facility] ED but due to long wait time, presented to [facility] [place]. CT abdomen and pelvis in the ED showed sigmoid diverticulitis. Subsequent C. difficile PCR was positive.   Assessment/Plan:  1. Acute sigmoid diverticulitis: Treated empirically with IV Flagyl and ceftriaxone. As discussed with infectious disease M.D. on call on 06/18/14, plan was to transition to oral Flagyl and Cipro for 2 weeks and then continue Flagyl for an additional week thereafter. Patient however did not tolerate Flagyl (worsening nausea, vomiting and diarrhea) and was switched to PO vancomycin. Patient on probiotics. Clinically better than yesterday.  Due to lack of consistent improvement, consulted GI to evaluate and assist in management. ? Repeat imaging. 2. C. difficile diarrhea: Antimicrobial management as above. No recent antibiotic exposure. 3. Hypertension: Continue verapamil. Intermittently uncontrolled-? Related to stress and pain. When necessary IV hydralazine. 4. Sleep apnea: Continue nightly C Pap 5. Hypokalemia: Improved after supplements. 6. History of pulmonary sarcoidosis: Intermittently on prednisone but not recently. 7. History of von Willebrand's disease: No bleeding reported. 8. History of kidney stones: CT on admission showed probable tiny punctate stone in the distal right ureter with mild proximal obstruction.  Patient's current abdominal pain likely not from this. Discussed with urologist on call on 3/12 and recommended obtaining a renal ultrasound. Renal ultrasound without hydroureteronephrosis.    Code Status: Full Family Communication:  Discussed with spouse - updated care and answered questions 06/19/2014. Disposition Plan: Home possibly 3/15 pending medical stability.   Consultants:  None  Procedures:  None  Antibiotics:  IV Rocephin 3/8 > 3/12  IV Flagyl 3/8-3/11  PO Flagyl 3/12 >3/13  PO Cipro 3/12 >  PO Vanc 3/13>  Subjective: Had several episodes of nausea, vomiting, diarrhea and abdominal pain on 3/13. Feels better today. No nausea or vomiting overnight. Tolerated some breakfast this morning. Had BM 1 this morning with soft stools. Abdominal pain intermittent but less. No palpitations.  Objective: Filed Vitals:   06/19/14 2328 06/20/14 0129 06/20/14 0617 06/20/14 1526  BP: 170/72 174/85 151/76 178/100  Pulse:   110 115  Temp:   99.3 F (37.4 C) 100.5 F (38.1 C)  TempSrc:   Oral Oral  Resp:   17 16  Height:      Weight:      SpO2:   96% 98%    Intake/Output Summary (Last 24 hours) at 06/20/14 1554 Last data filed at 06/20/14 1529  Gross per 24 hour  Intake 1401.67 ml  Output   1310 ml  Net  91.67 ml   Filed Weights   06/14/14 1232 06/15/14 0827  Weight: 89.359 kg (197 lb) 85.9 kg (189 lb 6 oz)     Exam:  General exam: Pleasant middle-aged female sitting up comfortably at edge of bed this morning. Looks much improved compared to last evening. Respiratory system: Clear. No increased work of breathing. Cardiovascular system: S1 & S2 heard, RRR. No JVD, murmurs, gallops, clicks or pedal edema. Gastrointestinal system:  Abdomen is nondistended, soft and mild midabdominal tenderness without peritoneal signs. Normal bowel sounds heard. Central nervous system: Alert and oriented. No focal neurological deficits. Extremities: Symmetric 5 x 5  power.   Data Reviewed: Basic Metabolic Panel:  Recent Labs Lab 06/14/14 1300 06/15/14 1120 06/18/14 1640 06/19/14 0628 06/19/14 1847 06/20/14 0633  NA 134* 137 143 144  --  139  K 3.9 3.2* 2.9* 3.3*  --  4.1  CL 99 102 105 106  --  105  CO2 --  25  GLUCOSE 156* 148* 113* 88  --  108*  BUN --  10  CREATININE 0.98 0.82 0.91 0.82  --  0.76  CALCIUM 9.0 8.9 8.7 8.9  --  9.1  MG  --   --   --   --  1.7 1.8   Liver Function Tests:  Recent Labs Lab 06/14/14 1300 06/15/14 1120  AST 29 22  ALT 16 14  ALKPHOS 106 83  BILITOT 0.8 0.6  PROT 8.6* 7.3  ALBUMIN 4.2 3.3*    Recent Labs Lab 06/14/14 1300  LIPASE 28   No results for input(s): AMMONIA in the last 168 hours. CBC:  Recent Labs Lab 06/14/14 1300 06/15/14 1120 06/18/14 1640  WBC 14.5* 13.4* 6.9  NEUTROABS 13.0* 11.6*  --   HGB 16.9* 15.0 13.9  HCT 51.3* 45.3 42.1  MCV 80.5 81.0 81.3  PLT 249 255 218   Cardiac Enzymes: No results for input(s): CKTOTAL, CKMB, CKMBINDEX, TROPONINI in the last 168 hours. BNP (last 3 results) No results for input(s): PROBNP in the last 8760 hours. CBG: No results for input(s): GLUCAP in the last 168 hours.  Recent Results (from the past 240 hour(s))  Urine culture     Status: None   Collection Time: 06/14/14 10:05 PM  Result Value Ref Range Status   Specimen Description URINE, CLEAN CATCH  Final   Special Requests NONE  Final   Colony Count   Final    >=100,000 COLONIES/ML Performed at [facility]    Culture   Final    Multiple bacterial morphotypes present, none predominant. Suggest appropriate recollection if clinically indicated. Performed at [facility]    Report Status 06/16/2014 FINAL  Final  Culture, blood (routine x 2)     Status: None (Preliminary result)   Collection Time: 06/15/14  8:10 AM  Result Value Ref Range Status   Specimen Description BLOOD LEFT ARM  Final   Special Requests BOTTLES DRAWN AEROBIC  ONLY 5CC  Final   Culture   Final           BLOOD CULTURE RECEIVED NO GROWTH TO DATE CULTURE WILL BE HELD FOR 5 DAYS BEFORE ISSUING A FINAL NEGATIVE REPORT Performed at [facility]    Report Status PENDING  Incomplete  Culture, blood (routine x 2)     Status: None (Preliminary result)   Collection Time: 06/15/14  8:20 AM  Result Value Ref Range Status   Specimen Description BLOOD LEFT ARM  Final   Special Requests BOTTLES DRAWN AEROBIC AND ANAEROBIC 10CC  Final   Culture   Final           BLOOD CULTURE RECEIVED NO GROWTH TO DATE CULTURE WILL BE HELD FOR 5 DAYS BEFORE ISSUING A FINAL NEGATIVE REPORT Performed at [facility]    Report Status PENDING  Incomplete  Stool culture     Status: None  Collection Time: 06/16/14  2:06 PM  Result Value Ref Range Status   Specimen Description STOOL  Final   Special Requests NONE  Final   Culture   Final    MODERATE YEAST NO SALMONELLA, SHIGELLA, CAMPYLOBACTER, YERSINIA, OR E.COLI [id number] ISOLATED Note: REDUCED NORMAL FLORA PRESENT [facility]formed at [facility]    Report Status 06/20/2014 FINAL  Final  Clostridium Difficile by PCR     Status: Abnormal   Collection Time: 06/16/14  2:06 PM  Result Value Ref Range Status   C difficile by pcr POSITIVE (A) NEGATIVE Final    Comment: CRITICAL RESULT CALLED TO, READ BACK BY AND VERIFIED WITH: Alexia FreestoneN. WINDHAM RN 17:15 06/16/14 ([email or web address])            Studies: [facility] Renal  06/18/2014   CLINICAL DATA:  64 year old female with potential distal right ureteral stone identified on CT scan from 06/14/2014. Evaluate for hydronephrosis.  EXAM: RENAL/URINARY TRACT ULTRASOUND COMPLETE  COMPARISON:  CT of the abdomen and pelvis 06/14/2014.  FINDINGS: Examination limited by the patient's large body habitus.  Right Kidney:  Length: 9.4 cm. Echogenicity within normal limits. No mass or hydronephrosis visualized.  Left Kidney:  Length: 10.2 cm. Echogenicity within normal limits. No mass or  hydronephrosis visualized.  Bladder:  Appears normal for degree of bladder distention.  IMPRESSION: 1. No hydroureteronephrosis. 2. Limited examination secondary to the patient's large body habitus. With these limitations in mind, the appearance of the kidneys and urinary bladder is normal.   Electronically Signed   By: Trudie Reedaniel  Entrikin M.D.   On: 06/18/2014 17:26        Scheduled Meds: . brinzolamide  1 drop Both Eyes BID  . ciprofloxacin  500 mg Oral BID  . FLUoxetine  60 mg Oral Daily  . pantoprazole  40 mg Oral Daily  . saccharomyces boulardii  250 mg Oral BID  . vancomycin  125 mg Oral 4 times per day  . verapamil  180 mg Oral Daily   Continuous Infusions:   Active Problems:   Diverticulitis   Acute diverticulitis   Nausea vomiting and diarrhea   Essential hypertension   Sleep apnea   Sigmoid diverticulitis   Right kidney stone    Time spent: 30 minutes.    Marcellus ScottHONGALGI,Vinnie Bobst, MD, FACP, FHM. [facility] Pager [phone]  If 7PM-7AM, please contact night-coverage [email or web address] Password [email or web address] 06/20/2014, 3:54 PM    LOS: 5 days

## 2014-06-21 DIAGNOSIS — A0472 Enterocolitis due to Clostridium difficile, not specified as recurrent: Secondary | ICD-10-CM | POA: Insufficient documentation

## 2014-06-21 LAB — CULTURE, BLOOD (ROUTINE X 2)
CULTURE: NO GROWTH
Culture: NO GROWTH

## 2014-06-21 LAB — CBC
HCT: 43 % (ref 36.0–46.0)
Hemoglobin: 13.6 g/dL (ref 12.0–15.0)
MCH: 26.3 pg (ref 26.0–34.0)
MCHC: 31.6 g/dL (ref 30.0–36.0)
MCV: 83.2 fL (ref 78.0–100.0)
Platelets: 239 10*3/uL (ref 150–400)
RBC: 5.17 MIL/uL — AB (ref 3.87–5.11)
RDW: 14 % (ref 11.5–15.5)
WBC: 10.9 10*3/uL — AB (ref 4.0–10.5)

## 2014-06-21 LAB — BASIC METABOLIC PANEL
ANION GAP: 7 (ref 5–15)
BUN: 15 mg/dL (ref 6–23)
CALCIUM: 8.7 mg/dL (ref 8.4–10.5)
CO2: 26 mmol/L (ref 19–32)
Chloride: 107 mmol/L (ref 96–112)
Creatinine, Ser: 0.91 mg/dL (ref 0.50–1.10)
GFR calc Af Amer: 75 mL/min — ABNORMAL LOW (ref >90)
GFR, EST NON AFRICAN AMERICAN: 65 mL/min — AB (ref >90)
Glucose, Bld: 69 mg/dL — ABNORMAL LOW (ref 70–99)
POTASSIUM: 4.7 mmol/L (ref 3.5–5.1)
SODIUM: 140 mmol/L (ref 135–145)

## 2014-06-21 MED ORDER — VANCOMYCIN 50 MG/ML ORAL SOLUTION
125.0000 mg | Freq: Four times a day (QID) | ORAL | Status: DC
Start: 2014-06-21 — End: 2014-06-23
  Administered 2014-06-21 – 2014-06-23 (×7): 125 mg via ORAL
  Filled 2014-06-21 (×11): qty 2.5

## 2014-06-21 MED ORDER — ONDANSETRON HCL 4 MG PO TABS
4.0000 mg | ORAL_TABLET | Freq: Four times a day (QID) | ORAL | Status: DC
  Administered 2014-06-21 – 2014-06-23 (×8): 4 mg via ORAL
  Filled 2014-06-21 (×11): qty 1

## 2014-06-21 MED ORDER — VANCOMYCIN 50 MG/ML ORAL SOLUTION
125.0000 mg | Freq: Four times a day (QID) | ORAL | Status: DC
  Filled 2014-06-21 (×4): qty 2.5

## 2014-06-21 NOTE — Progress Notes (Signed)
Spoke with patient earlier in the shift about CPAP. Pt has home CPAP and places herself on when ready.

## 2014-06-21 NOTE — Progress Notes (Signed)
PROGRESS NOTE    Deanna Allison ZOX:[id number] DOB: 1950-08-02 DOA: 06/14/2014 PCP: Angelica Chessman., MD  Primary GI: Dr. 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 She was advised to go to the ER and went to the [facility] ED but due to long wait time, presented to [facility] [place]. CT abdomen and pelvis in the ED showed sigmoid diverticulitis. Subsequent C. difficile PCR was positive.   Assessment/Plan:  1. Acute sigmoid diverticulitis: On IV antibiotics since 06/14/14. Treat for total of 14 days. Initially was on IV Rocephin and IV Flagyl. Now on oral Cipro plus vancomycin (did not tolerate oral Flagyl)-treating for C. Difficile. Patient has been having issues with intermittent nausea, vomiting, abdominal pain and diarrhea over the last 2-3 days and several antibiotics changes made. Last night, oral vancomycin was switched back to IV Flagyl for unclear reasons (had single episode of vomiting). By mouth vancomycin resumed. On scheduled Zofran. 2. C. difficile diarrhea: Oral vancomycin. No recent antibiotic exposure. After 14 days of antibiotics for diverticulitis, recommend additional 1 week of oral vancomycin. 3. Hypertension: Continue verapamil. Intermittently uncontrolled-? Related to stress and pain. When necessary IV hydralazine. 4. Sleep apnea: Continue nightly C Pap 5. Hypokalemia: Improved after supplements. 6. History of pulmonary sarcoidosis: Intermittently on prednisone but not recently. 7. History of von Willebrand's disease: No bleeding reported. 8. History of kidney stones: CT on admission showed probable tiny  punctate stone in the distal right ureter with mild proximal obstruction. Patient's current abdominal pain likely not from this. Discussed with urologist on call on 3/12 and recommended obtaining a renal ultrasound. Renal ultrasound without hydroureteronephrosis.     Code Status: Full Family Communication:  Discussed with spouse - updated care and answered questions 06/19/2014. Disposition Plan: Home when medically stable   Consultants:  [facility]  Procedures:  None  Antibiotics:  IV Rocephin 3/8 > 3/12  IV Flagyl 3/8-3/11  PO Flagyl 3/12 >3/13  PO Cipro 3/12 >  PO Vanc 3/13>  Subjective: Had one episode of vomiting after oral vancomycin yesterday. Intermittent nausea. Diarrhea improved. Intermittent tolerable abdominal pain.  Objective: Filed Vitals:   06/21/14 0000 06/21/14 0235 06/21/14 0629 06/21/14 1354  BP:   116/64 103/57  Pulse:    98  Temp:   98.1 F (36.7 C) 98.7 F (37.1 C)  TempSrc:   Oral Oral  Resp: Height:      Weight:      SpO2:   100% 98%    Intake/Output Summary (Last 24 hours) at 06/21/14 1922 Last data filed at 06/21/14 1848  Gross per 24 hour  Intake    360 ml  Output      0 ml  Net    360 ml   Filed Weights   06/14/14 1232 06/15/14 0827  Weight: 89.359 kg (197 lb) 85.9 kg (189 lb 6 oz)     Exam:  General exam: Pleasant middle-aged female lying comfortably in bed.  Respiratory system: Clear. No increased work of breathing. Cardiovascular system: S1 & S2 heard, RRR. No JVD, murmurs, gallops, clicks or pedal edema. Telemetry: Sinus rhythm in 90s-ST 100s Gastrointestinal system: Abdomen is nondistended, soft and mild midabdominal  tenderness without peritoneal signs. Normal bowel sounds heard. Central nervous system: Alert and oriented. No focal neurological deficits. Extremities: Symmetric 5 x 5 power.   Data Reviewed: Basic Metabolic Panel:  Recent Labs Lab 06/15/14 1120 06/18/14 1640 06/19/14 0628  06/19/14 1847 06/20/14 0633 06/21/14 0541  NA 137 143 144  --  139 140  K 3.2* 2.9* 3.3*  --  4.1 4.7  CL 102 105 106  --  105 107  CO2 26 30 29   --  25 26  GLUCOSE 148* 113* 88  --  108* 69*  BUN 14 16 12   --  10 15  CREATININE 0.82 0.91 0.82  --  0.76 0.91  CALCIUM 8.9 8.7 8.9  --  9.1 8.7  MG  --   --   --  1.7 1.8  --    Liver Function Tests:  Recent Labs Lab 06/15/14 1120  AST 22  ALT 14  ALKPHOS 83  BILITOT 0.6  PROT 7.3  ALBUMIN 3.3*   No results for input(s): LIPASE, AMYLASE in the last 168 hours. No results for input(s): AMMONIA in the last 168 hours. CBC:  Recent Labs Lab 06/15/14 1120 06/18/14 1640 06/21/14 0541  WBC 13.4* 6.9 10.9*  NEUTROABS 11.6*  --   --   HGB 15.0 13.9 13.6  HCT 45.3 42.1 43.0  MCV 81.0 81.3 83.2  PLT 255 218 239   Cardiac Enzymes: No results for input(s): CKTOTAL, CKMB, CKMBINDEX, TROPONINI in the last 168 hours. BNP (last 3 results) No results for input(s): PROBNP in the last 8760 hours. CBG: No results for input(s): GLUCAP in the last 168 hours.  Recent Results (from the past 240 hour(s))  Urine culture     Status: None   Collection Time: 06/14/14 10:05 PM  Result Value Ref Range Status   Specimen Description URINE, CLEAN CATCH  Final   Special Requests NONE  Final   Colony Count   Final    >=100,000 COLONIES/ML Performed at [facility]    Culture   Final    Multiple bacterial morphotypes present, none predominant. Suggest appropriate recollection if clinically indicated. Performed at [facility]    Report Status 06/16/2014 FINAL  Final  Culture, blood (routine x 2)     Status: None   Collection Time: 06/15/14  8:10 AM  Result Value Ref Range Status   Specimen Description BLOOD LEFT ARM  Final   Special Requests BOTTLES DRAWN AEROBIC ONLY 5CC  Final   Culture   Final    NO GROWTH 5 DAYS Performed at [facility]    Report Status 06/21/2014 FINAL  Final  Culture, blood (routine x 2)      Status: None   Collection Time: 06/15/14  8:20 AM  Result Value Ref Range Status   Specimen Description BLOOD LEFT ARM  Final   Special Requests BOTTLES DRAWN AEROBIC AND ANAEROBIC 10CC  Final   Culture   Final    NO GROWTH 5 DAYS Performed at [facility]    Report Status 06/21/2014 FINAL  Final  Stool culture     Status: None   Collection Time: 06/16/14  2:06 PM  Result Value Ref Range Status   Specimen Description STOOL  Final   Special Requests NONE  Final   Culture   Final    MODERATE YEAST NO SALMONELLA, SHIGELLA, CAMPYLOBACTER, YERSINIA, OR E.COLI 0157:H7 ISOLATED Note: REDUCED NORMAL FLORA PRESENT Performed at [facility]  Report Status 06/20/2014 FINAL  Final  Clostridium Difficile by PCR     Status: Abnormal   Collection Time: 06/16/14  2:06 PM  Result Value Ref Range Status   C difficile by pcr POSITIVE (A) NEGATIVE Final    Comment: CRITICAL RESULT CALLED TO, READ BACK BY AND VERIFIED WITH: Alexia Freestone RN 17:15 06/16/14 ([email or web address])            Studies: No results found.      Scheduled Meds: . brinzolamide  1 drop Both Eyes BID  . ciprofloxacin  500 mg Oral BID  . famotidine  20 mg Oral BID  . FLUoxetine  60 mg Oral Daily  . ondansetron  4 mg Oral QID  . saccharomyces boulardii  250 mg Oral BID  . vancomycin  125 mg Oral 4 times per day  . verapamil  180 mg Oral Daily   Continuous Infusions:   Active Problems:   Diverticulitis   Acute diverticulitis   Nausea vomiting and diarrhea   Essential hypertension   Sleep apnea   Sigmoid diverticulitis   Right kidney stone    Time spent: 30 minutes.    Marcellus Scott, MD, FACP, FHM. [facility] Pager [phone]  If 7PM-7AM, please contact night-coverage [email or web address] Password [email or web address] 06/21/2014, 7:22 PM    LOS: 6 days

## 2014-06-21 NOTE — Progress Notes (Signed)
    Progress Note   Subjective  Transient periumbilical pain. Still having intermittent nausea, especially when taking PO meds. No diarrhea this am   Objective   Vital signs in last 24 hours: Temp:  [98.1 F (36.7 C)-100.5 F (38.1 C)] 98.1 F (36.7 C) (03/15 0629) Pulse Rate:  [112-115] 112 (03/14 1630) Resp:  [16-19] 18 (03/15 0629) BP: (116-178)/(62-100) 116/64 mmHg (03/15 0629) SpO2:  [96 %-100 %] 100 % (03/15 0629) Last BM Date: 06/20/14 General:   black female in NAD Lungs: Respirations even and unlabored, lungs CTA bilaterally Abdomen:  Soft, mild mid abdominal tenderness.  Normal bowel sounds. Extremities:  Without edema. Neurologic:  Alert and oriented,  grossly normal neurologically. Psych:  Cooperative. Normal mood and affect.    Lab Results:  Recent Labs  06/18/14 1640 06/21/14 0541  WBC 6.9 10.9*  HGB 13.9 13.6  HCT 42.1 43.0  PLT 218 239   BMET  Recent Labs  06/19/14 0628 06/20/14 0633 06/21/14 0541  NA 144 139 140  K 3.3* 4.1 4.7  CL 106 105 107  CO2 29 25 26   GLUCOSE 88 108* 69*  BUN 12 10 15   CREATININE 0.82 0.76 0.91  CALCIUM 8.9 9.1 8.7      Assessment / Plan:   131. 64 year old female admitted with diverticulitis and c-diff.  On cipro for diverticulitis. She didn't tolerate PO Flagyl or PO vanco because of nausea,  changed to IV flagyl last night.  Difficult to discharge her home since not tolerating PO antibiotics. Will start scheduled Zofran in hopes of improving nausea so she can tolerate oral meds. I don't see a diet for her. Will order clears.    LOS: 6 days   Willette Clusteraula Guenther  06/21/2014, 11:07 AM   GI ATTENDING  History and data reviewed. Patient personally seen and examined. Agree with interval progress note as written above. Overall the patient is improving. Nausea without vomiting. No diarrhea. Minimal pain. Not sure that I would stop oral vancomycin at this point. Please resume oral vancomycin 125 mg 4 times a day 2 weeks.  Also, agree with running Zofran. Advance diet as tolerated. If after several days it is decided that she is intolerant to vancomycin, then consider Dificid. We'll follow  Wilhemina BonitoJohn N. Eda KeysPerry, Jr., M.D. [facility] of Gastroenterology

## 2014-06-21 NOTE — Clinical Social Work Note (Signed)
Patient's RN requested CSW's assistance with patient's jury duty situation. Patient is expected to be at jury duty tomorrow but will likely miss this. CSW reviewed juror information form with patient. Form indicates that written requests must be made at least 10 days prior to date of services and emergency excuses are granted in person. CSW left message with clerk of superior court to request what information patient needs to provide and when/how. CSW will wait for return call. CSW explained to patient that a letter indicating the dates of her hospitalization will be provided to her prior to DC.   Roddie McBryant Kadin Bera MSW, [place]CSWA, [place], [phone]

## 2014-06-22 LAB — BASIC METABOLIC PANEL
Anion gap: 5 (ref 5–15)
BUN: 12 mg/dL (ref 6–23)
CALCIUM: 9 mg/dL (ref 8.4–10.5)
CO2: 28 mmol/L (ref 19–32)
Chloride: 106 mmol/L (ref 96–112)
Creatinine, Ser: 0.92 mg/dL (ref 0.50–1.10)
GFR calc Af Amer: 74 mL/min — ABNORMAL LOW (ref >90)
GFR, EST NON AFRICAN AMERICAN: 64 mL/min — AB (ref >90)
Glucose, Bld: 73 mg/dL (ref 70–99)
Potassium: 4.1 mmol/L (ref 3.5–5.1)
Sodium: 139 mmol/L (ref 135–145)

## 2014-06-22 LAB — GLUCOSE, CAPILLARY
GLUCOSE-CAPILLARY: 100 mg/dL — AB (ref 70–99)
GLUCOSE-CAPILLARY: 98 mg/dL (ref 70–99)
Glucose-Capillary: 80 mg/dL (ref 70–99)
Glucose-Capillary: 88 mg/dL (ref 70–99)

## 2014-06-22 MED ORDER — VANCOMYCIN 50 MG/ML ORAL SOLUTION: 125.0000 mg | Freq: Four times a day (QID) | ORAL | Status: DC

## 2014-06-22 MED ORDER — METRONIDAZOLE 500 MG PO TABS
500.0000 mg | ORAL_TABLET | Freq: Three times a day (TID) | ORAL | Status: DC
  Administered 2014-06-22 – 2014-06-23 (×3): 500 mg via ORAL
  Filled 2014-06-22 (×6): qty 1

## 2014-06-22 MED ORDER — VANCOMYCIN HCL 125 MG PO CAPS: 125.0000 mg | ORAL_CAPSULE | Freq: Four times a day (QID) | ORAL | Status: DC

## 2014-06-22 NOTE — Progress Notes (Signed)
     [facility] Progress Note  Subjective:  Feels much better.  Would like to try some solid food and possibly go home today.  One soft stool so far today.  Objective:  Vital signs in last 24 hours: Temp:  [98.4 F (36.9 C)-99.2 F (37.3 C)] 98.4 F (36.9 C) (03/16 0538) Pulse Rate:  [88-101] 101 (03/16 0538) Resp:  [18-20] 18 (03/16 0538) BP: (103-138)/(57-62) 137/62 mmHg (03/16 0538) SpO2:  [95 %-99 %] 99 % (03/16 0538) Last BM Date: 06/20/14 General:  Alert, Well-developed, in NAD Heart:  Slightly tachy but regular. Pulm:  CTAB.  No W/R/R. Abdomen:  Soft, non-distended.  BS present.  Non-tender. Extremities:  Without edema. Neurologic:  Alert and  oriented x 4;  grossly normal neurologically. Psych:  Alert and cooperative. Normal mood and affect.  Intake/Output from previous day: 03/15 0701 - 03/16 0700 In: 360 [P.O.:360] Out: -   Lab Results:  Recent Labs  06/21/14 0541  WBC 10.9*  HGB 13.6  HCT 43.0  PLT 239   BMET  Recent Labs  06/20/14 0633 06/21/14 0541 06/22/14 0615  NA 139 140 139  K 4.1 4.7 4.1  CL 105 107 106  CO2 25 26 28   GLUCOSE 108* 69* 73  BUN 10 15 12   CREATININE 0.76 0.91 0.92  CALCIUM 9.1 8.7 9.0   Assessment / Plan: 511. 64 year old female admitted with diverticulitis and C-diff. On cipro for diverticulitis. She didn't tolerate PO Flagyl or PO vanco because of nausea, changed to IV flagyl 3/14 by primary service but switched back to PO vancomycin by GI 3/15. Doing better today.  Receiving ATC zofran.  Will advance diet and if she does well with that then ok for discharge later today.  Should complete 14 day course of antibiotics.  Continue floratstor BID for now as well.  Should go home with some zofran as well.  *Will follow-up with her outpatient GI in [place].   LOS: 7 days   ZEHR, JESSICA D.  06/22/2014, 8:54 AM  Pager number [phone]   GI ATTENDING  Interval history and data reviewed. Agree with progress note.  Complete 14 days of PO vanco. Give Zofran as needed.She will follow up with her primary GI in [place] (Dr. Karena AddisonMary Shearin). Will sign off  Keara Pagliarulo N. Eda KeysPerry, Jr., M.D. [facility]

## 2014-06-22 NOTE — Progress Notes (Signed)
PROGRESS NOTE    Deanna Allison ZOX:[id number] DOB: Aug 18, 1950 DOA: 06/14/2014 PCP: Angelica Chessman., MD  Primary GI: Dr. 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 She was advised to go to the ER and went to the [facility] ED but due to long wait time, presented to [facility] [place]. CT abdomen and pelvis in the ED showed sigmoid diverticulitis. Subsequent C. difficile PCR was positive.   Assessment/Plan:  1. Acute sigmoid diverticulitis: On IV antibiotics since 06/14/14. Treat for total of 14 days. Initially was on IV Rocephin and IV Flagyl. Now on oral Cipro plus vancomycin (did not tolerate oral Flagyl)-treating for C. Difficile. Patient has been having issues with intermittent nausea, vomiting, abdominal pain and diarrhea over the last 2-3 days and several antibiotics changes made.  On scheduled Zofran. Discussed with infectious disease, at this point there is no anaerobic coverage, Augmentin was an option, but patient has penicillin allergy, so at this point will challenge patient again with oral Flagyl as she is on standing dose of Zofran. 2. C. difficile diarrhea: Oral vancomycin. No recent antibiotic exposure. After 14 days of antibiotics for diverticulitis, recommend additional 1 week of oral vancomycin. 3. Hypertension: Continue verapamil. Intermittently uncontrolled-? Related to stress and pain. When necessary IV hydralazine. 4. Sleep apnea: Continue nightly C Pap 5. Hypokalemia: Improved after supplements. 6. History of pulmonary sarcoidosis: Intermittently on prednisone but not recently. 7. History of von Willebrand's  disease: No bleeding reported. 8. History of kidney stones: CT on admission showed probable tiny punctate stone in the distal right ureter with mild proximal obstruction. Patient's current abdominal pain likely not from this. Discussed with urologist on call on 3/12 and recommended obtaining a renal ultrasound. Renal ultrasound without hydroureteronephrosis.     Code Status: Full Family Communication:  Discussed with spouse - updated care and answered questions 06/19/2014. Disposition Plan: Home when medically stable   Consultants:  [facility]  Procedures:  None  Antibiotics:  IV Rocephin 3/8 > 3/12  IV Flagyl 3/8-3/11, resumed on 3/16  PO Flagyl 3/12 >3/13  PO Cipro 3/12 >  PO Vanc 3/13>  Subjective: Had one episode of vomiting after oral vancomycin yesterday. Intermittent nausea. Diarrhea improved. Intermittent tolerable abdominal pain.  Objective: Filed Vitals:   06/21/14 0629 06/21/14 1354 06/21/14 2132 06/22/14 0538  BP: 116/64 103/57 138/61 137/62  Pulse:  98 88 101  Temp: 98.1 F (36.7 C) 98.7 F (37.1 C) 99.2 F (37.3 C) 98.4 F (36.9 C)  TempSrc: Oral Oral Oral Oral  Resp: Height:      Weight:      SpO2: 100% 98% 95% 99%    Intake/Output Summary (Last 24 hours) at 06/22/14 1249 Last data filed at 06/22/14 1035  Gross per 24 hour  Intake    220 ml  Output      0 ml  Net    220 ml   Filed Weights   06/14/14 1232 06/15/14 0827  Weight: 89.359 kg (197 lb) 85.9 kg (189 lb 6 oz)     Exam:  General exam: Pleasant middle-aged female lying comfortably in bed.  Respiratory system: Clear. No increased work of breathing. Cardiovascular system:  S1 & S2 heard, RRR. No JVD, murmurs, gallops, clicks or pedal edema. Gastrointestinal system: Abdomen is nondistended, soft and mild midabdominal tenderness without peritoneal signs. Normal bowel sounds heard. Central nervous system: Alert and oriented. No focal neurological deficits. Extremities:  Symmetric 5 x 5 power.   Data Reviewed: Basic Metabolic Panel:  Recent Labs Lab 06/18/14 1640 06/19/14 0628 06/19/14 1847 06/20/14 0633 06/21/14 0541 06/22/14 0615  NA 143 144  --  139 140 139  K 2.9* 3.3*  --  4.1 4.7 4.1  CL 105 106  --  105 107 106  CO2 30 29  --  GLUCOSE 113* 88  --  108* 69* 73  BUN 16 12  --  CREATININE 0.91 0.82  --  0.76 0.91 0.92  CALCIUM 8.7 8.9  --  9.1 8.7 9.0  MG  --   --  1.7 1.8  --   --    Liver Function Tests: No results for input(s): AST, ALT, ALKPHOS, BILITOT, PROT, ALBUMIN in the last 168 hours. No results for input(s): LIPASE, AMYLASE in the last 168 hours. No results for input(s): AMMONIA in the last 168 hours. CBC:  Recent Labs Lab 06/18/14 1640 06/21/14 0541  WBC 6.9 10.9*  HGB 13.9 13.6  HCT 42.1 43.0  MCV 81.3 83.2  PLT 218 239   Cardiac Enzymes: No results for input(s): CKTOTAL, CKMB, CKMBINDEX, TROPONINI in the last 168 hours. BNP (last 3 results) No results for input(s): PROBNP in the last 8760 hours. CBG:  Recent Labs Lab 06/22/14 0805 06/22/14 1150  GLUCAP 80 100*    Recent Results (from the past 240 hour(s))  Urine culture     Status: None   Collection Time: 06/14/14 10:05 PM  Result Value Ref Range Status   Specimen Description URINE, CLEAN CATCH  Final   Special Requests NONE  Final   Colony Count   Final    >=100,000 COLONIES/ML Performed at [facility]    Culture   Final    Multiple bacterial morphotypes present, none predominant. Suggest appropriate recollection if clinically indicated. Performed at [facility]    Report Status 06/16/2014 FINAL  Final  Culture, blood (routine x 2)     Status: None   Collection Time: 06/15/14  8:10 AM  Result Value Ref Range Status   Specimen Description BLOOD LEFT ARM  Final   Special Requests BOTTLES DRAWN AEROBIC ONLY 5CC  Final   Culture   Final    NO GROWTH 5 DAYS Performed at [facility]    Report  Status 06/21/2014 FINAL  Final  Culture, blood (routine x 2)     Status: None   Collection Time: 06/15/14  8:20 AM  Result Value Ref Range Status   Specimen Description BLOOD LEFT ARM  Final   Special Requests BOTTLES DRAWN AEROBIC AND ANAEROBIC 10CC  Final   Culture   Final    NO GROWTH 5 DAYS Performed at [facility]    Report Status 06/21/2014 FINAL  Final  Stool culture     Status: None   Collection Time: 06/16/14  2:06 PM  Result Value Ref Range Status   Specimen Description STOOL  Final   Special Requests NONE  Final   Culture   Final    MODERATE YEAST NO SALMONELLA, SHIGELLA, CAMPYLOBACTER, YERSINIA, OR E.COLI 0157:H7 ISOLATED Note: REDUCED NORMAL FLORA PRESENT Performed at [facility]    Report  Status 06/20/2014 FINAL  Final  Clostridium Difficile by PCR     Status: Abnormal   Collection Time: 06/16/14  2:06 PM  Result Value Ref Range Status   C difficile by pcr POSITIVE (A) NEGATIVE Final    Comment: CRITICAL RESULT CALLED TO, READ BACK BY AND VERIFIED WITH: Alexia FreestoneN. [place] RN 17:15 06/16/14 ([email or web address])            Studies: No results found.      Scheduled Meds: . brinzolamide  1 drop Both Eyes BID  . ciprofloxacin  500 mg Oral BID  . famotidine  20 mg Oral BID  . FLUoxetine  60 mg Oral Daily  . ondansetron  4 mg Oral QID  . saccharomyces boulardii  250 mg Oral BID  . vancomycin  125 mg Oral 4 times per day  . verapamil  180 mg Oral Daily   Continuous Infusions:   Active Problems:   Diverticulitis   Acute diverticulitis   Nausea vomiting and diarrhea   Essential hypertension   Sleep apnea   Sigmoid diverticulitis   Right kidney stone   C. difficile colitis    Time spent: 30 minutes.    Huey BienenstockELGERGAWY, Valarie Farace, MD,  [facility] Pager [phone]  If 7PM-7AM, please contact night-coverage [email or web address] Password [email or web address] 06/22/2014, 12:49 PM    LOS: 7 days

## 2014-06-22 NOTE — Progress Notes (Signed)
Patient is using her home CPAP and places herself on and off with no complications. She was told to call the RT if she needed any help later.

## 2014-06-22 NOTE — Progress Notes (Deleted)
          Daily Rounding Note  06/22/2014, 9:23 AM  LOS: 7 days   SUBJECTIVE:       3 soft stools yesterday, one this AM.  No abd pain.  No nausea.   Tolerating clears and asking for advance of diet.   OBJECTIVE:         Vital signs in last 24 hours:    Temp:  [98.4 F (36.9 C)-99.2 F (37.3 C)] 98.4 F (36.9 C) (03/16 0538) Pulse Rate:  [88-101] 101 (03/16 0538) Resp:  [18-20] 18 (03/16 0538) BP: (103-138)/(57-62) 137/62 mmHg (03/16 0538) SpO2:  [95 %-99 %] 99 % (03/16 0538) Last BM Date: 06/20/14 Filed Weights   06/14/14 1232 06/15/14 0827  Weight: 197 lb (89.359 kg) 189 lb 6 oz (85.9 kg)   General: looks well.  NAD.   Heart: RRR Chest: clear bil Abdomen: active BS, soft, NT  Extremities: no CCE Neuro/Psych:  Pleasant, in good spirits, intelligent.   Intake/Output from previous day: 03/15 0701 - 03/16 0700 In: 360 [P.O.:360] Out: -   Intake/Output this shift:    Lab Results:  Recent Labs  06/21/14 0541  WBC 10.9*  HGB 13.6  HCT 43.0  PLT 239   BMET  Recent Labs  06/20/14 0633 06/21/14 0541 06/22/14 0615  NA 139 140 139  K 4.1 4.7 4.1  CL 105 107 106  CO2 25 26 28   GLUCOSE 108* 69* 73  BUN 10 15 12   CREATININE 0.76 0.91 0.92  CALCIUM 9.1 8.7 9.0    Studies/Results: No results found.   Scheduled Meds: . brinzolamide  1 drop Both Eyes BID  . ciprofloxacin  500 mg Oral BID  . famotidine  20 mg Oral BID  . FLUoxetine  60 mg Oral Daily  . ondansetron  4 mg Oral QID  . saccharomyces boulardii  250 mg Oral BID  . vancomycin  125 mg Oral 4 times per day  . verapamil  180 mg Oral Daily   Continuous Infusions:  PRN Meds:.acetaminophen **OR** acetaminophen, hydrALAZINE, HYDROcodone-acetaminophen, HYDROmorphone (DILAUDID) injection, [DISCONTINUED] ondansetron **OR** ondansetron (ZOFRAN) IV, promethazine, tiZANidine, zolpidem   ASSESMENT:   *  Sigmoid diverticulitis and C diff.  Nausea with po  Flagyl and po Vanco. Zofran added and Vanc continued.   *  OSA.  On CPAP.  Sarcoidosis  *  Partially obstructing right ureteral stone.    PLAN   *  [facility]  Pt will follow up for virtual colonoscopy as outpt once she is feeling better and current problems are well behind her.     Jennye MoccasinSarah Barry Culverhouse  06/22/2014, 9:23 AM Pager: [phone]

## 2014-06-23 LAB — GLUCOSE, CAPILLARY: Glucose-Capillary: 135 mg/dL — ABNORMAL HIGH (ref 70–99)

## 2014-06-23 MED ORDER — SACCHAROMYCES BOULARDII 250 MG PO CAPS: 250.0000 mg | ORAL_CAPSULE | Freq: Two times a day (BID) | ORAL | Status: DC

## 2014-06-23 MED ORDER — METRONIDAZOLE 500 MG PO TABS: 500.0000 mg | ORAL_TABLET | Freq: Three times a day (TID) | ORAL | Status: AC

## 2014-06-23 MED ORDER — OXYCODONE HCL 30 MG PO TABS: 15.0000 mg | ORAL_TABLET | Freq: Four times a day (QID) | ORAL | Status: DC | PRN

## 2014-06-23 MED ORDER — CIPROFLOXACIN HCL 500 MG PO TABS: 500.0000 mg | ORAL_TABLET | Freq: Two times a day (BID) | ORAL | Status: AC

## 2014-06-23 NOTE — Progress Notes (Signed)
Medicare Important Message given?  YES (If response is "NO", the following Medicare IM given date fields will be blank) Date Medicare IM given:  06/23/14 Medicare IM given by:  Ladarien Beeks 

## 2014-06-23 NOTE — Discharge Summary (Signed)
Deanna Allison, 64 y.o., DOB 07/05/1950, MRN [id number]. Admission date: 06/14/2014 Discharge Date 06/23/2014 Primary MD Angelica ChessmanAGUIAR,RAFAELA M., MD Admitting Physician Yevonne PaxSaadat A Khan, MD    Admission Diagnosis  Right kidney stone [N20.0] Diverticulitis of large intestine without perforation or abscess without bleeding [K57.32] Fever, unspecified fever cause [R50.9]  Discharge Diagnosis   Active Problems:   Diverticulitis   Acute diverticulitis   Nausea vomiting and diarrhea   Essential hypertension   Sleep apnea   Sigmoid diverticulitis   Right kidney stone   C. difficile colitis      Past Medical History  Diagnosis Date  . Pulmonary sarcoidosis   . Fibromyalgia   . Heart valve disorder     trace AR, mild PR, mild to mod MR/TR, EF 60% 09/22/12 echo ([facility])  . Glaucoma   . Von Willebrand disease   . Diabetes mellitus without complication   . Cataracts, bilateral   . Diverticulitis   . Kidney stone   . PONV (postoperative nausea and vomiting)   . Hypertension     [facility]  . OSA (obstructive sleep apnea)     uses CPAP sleep study > 3 years ago  . Hearing loss of both ears     Past Surgical History  Procedure Laterality Date  . Cholecystectomy    . Appendectomy    . Abdominal hysterectomy    . Hernia repair    . Tibia fracture surgery    . Hand surgery    . Lung biopsy    . Multiple extractions with alveoloplasty N/A 03/18/2013    Procedure: MULTIPLE SURGICAL TEETH EXTRACTIONS;  Surgeon: Hinton DyerJoseph L Miller, DDS;  Location: [facility] OR;  Service: Oral Surgery;  Laterality: N/A;     Hospital Course See H&P, Labs, Consult and Test reports for all details in brief, patient was admitted for **  Active Problems:   Diverticulitis   Acute diverticulitis   Nausea vomiting and diarrhea   Essential hypertension   Sleep apnea   Sigmoid diverticulitis   Right kidney stone   C. difficile colitis  64 year old female with history of HTN, OSA on  nightly C Pap, pulmonary sarcoidosis on intermittent prednisone, Von Willebrand's disease, kidney stones, family history of colon cancer, was undergoing prep for virtual colonoscopy (scheduled 06/13/14) when she started experiencing nausea, vomiting, abdominal pain and diarrhea. She was advised to go to the ER and [facility] but due to long wait time, presented to [facility] [place]. CT abdomen and pelvis in the ED showed sigmoid diverticulitis. Subsequent C. difficile PCR was positive.  1. Acute sigmoid diverticulitis: On IV antibiotics since 06/14/14. Well Treat for total of 14 days. Initially was on IV Rocephin and IV Flagyl. Now on oral Cipro and Flagyl. Initially could not tolerate Flagyl to transition to oral vancomycin, vancomycin did not have coverage for anaerobes for her diverticulitis, and receiving Augmentin giving her penicillin allergy, resumed on Flagyl which she did tolerate very well, and discussed with GI and ID over the phone, to finish total of 14 days of antibiotic treatments for diverticulitis, and another 10 days of oral Flagyl for C. difficile colitis after oral antibiotic course is finished. 2. C. difficile diarrhea:  continue on oral Flagyl for 10 days after her oral antibiotic course for diverticulitis is finished  3. Hypertension: Continue verapamil.  4. Sleep apnea: Continue nightly C Pap 5. Hypokalemia: Improved after supplements. 6. History of pulmonary sarcoidosis: Intermittently on  prednisone but not recently. 7. History of von Willebrand's disease: No bleeding reported. 8. History of kidney stones: CT on admission showed probable tiny punctate stone in the distal right ureter with mild proximal obstruction. Patient's current abdominal pain likely not from this. Discussed with urologist on call on 3/12 and recommended obtaining a renal ultrasound. Renal ultrasound without hydroureteronephrosis.  Consults   Gastroenterology    Significant Tests:  See full reports for all details    Ct Abdomen Pelvis W Contrast  06/14/2014   CLINICAL DATA:  Pain around the umbilical area and pain radiating to the back. Nausea and vomiting. All starting today.  EXAM: CT ABDOMEN AND PELVIS WITH CONTRAST  TECHNIQUE: Multidetector CT imaging of the abdomen and pelvis was performed using the standard protocol following bolus administration of intravenous contrast.  CONTRAST:  50mL OMNIPAQUE IOHEXOL 300 MG/ML SOLN, OMNIPAQUE IOHEXOL 300 MG/ML SOLN  COMPARISON:  09/07/2012  FINDINGS: Atelectasis in the right lung base with elevation of the right hemidiaphragm. Mild Coronary artery calcification.  Surgical absence of the gallbladder. No bile duct dilatation. The liver, spleen, pancreas, adrenal glands, abdominal aorta, and inferior vena cava are unremarkable. There is a mild hydronephrosis and hydroureter on the right without significant delay in nephrogram. Suggestion of a tiny punctate stone in the distal right ureter. Left kidney and ureter are unremarkable. Moderately prominent lymph nodes in the celiac axis and retroperitoneum. Largest measuring up to about 10 mm, representing upper limits of normal size. These are likely to be reactive. Appearance is similar prior study. The stomach, small bowel, and colon are not abnormally distended. Diverticula seen throughout the colon. No free air or free fluid in the abdomen. Scarring in the anterior abdominal wall, probably postoperative and similar to prior study.  Pelvis: Diverticulosis of the sigmoid colon with inflammatory changes around the sigmoid region consistent with mild diverticulitis. No abscess. Bladder wall is not thickened. Uterus appears to be surgically absent. No free or loculated pelvic fluid collections. No pelvic mass or lymphadenopathy. There is a small amount of fluid in the right lower quadrant anterior to the ileus psoas muscle. This is nonspecific but may be reactive. Appendix is  not specifically identified. Degenerative changes in the spine. No destructive bone lesions.  IMPRESSION: Probable tiny punctate stone in the distal right ureter with mild proximal obstruction. Nonspecific free fluid in the right pelvis over the iliopsoas region. Diverticulosis of sigmoid colon with inflammatory changes consistent with diverticulitis. No abscess. Unchanged appearance of borderline enlarged lymph nodes in the celiac axis and retroperitoneum, likely reactive.   Electronically Signed   By: Burman Nieves M.D.   On: 06/14/2014 20:07   US Renal  06/18/2014   CLINICAL DATA:  64 year old female with potential distal right ureteral stone identified on CT scan from 06/14/2014. Evaluate for hydronephrosis.  EXAM: RENAL/URINARY TRACT ULTRASOUND COMPLETE  COMPARISON:  CT of the abdomen and pelvis 06/14/2014.  FINDINGS: Examination limited by the patient's large body habitus.  Right Kidney:  Length: 9.4 cm. Echogenicity within normal limits. No mass or hydronephrosis visualized.  Left Kidney:  Length: 10.2 cm. Echogenicity within normal limits. No mass or hydronephrosis visualized.  Bladder:  Appears normal for degree of bladder distention.  IMPRESSION: 1. No hydroureteronephrosis. 2. Limited examination secondary to the patient's large body habitus. With these limitations in mind, the appearance of the kidneys and urinary bladder is normal.   Electronically Signed   By: Trudie Reed M.D.   On: 06/18/2014 17:26   Dg  Abd Acute W/chest  06/14/2014   CLINICAL DATA:  Abdominal pain since yesterday with vomiting  EXAM: ACUTE ABDOMEN SERIES (ABDOMEN 2 VIEW & CHEST 1 VIEW)  COMPARISON:  02/12/2013  FINDINGS: There is a relative paucity of bowel gas. There is no evidence of dilated bowel loops or free intraperitoneal air. No radiopaque calculi or other significant radiographic abnormality is seen. Heart size is within normal limits. There is bilateral mediastinal fullness likely reflecting adenopathy as can  be seen with sarcoidosis. Elevation of the right diaphragm. Mild right midlung scarring.  No acute osseous abnormality.  IMPRESSION: Negative abdominal radiographs.  No acute cardiopulmonary disease.   Electronically Signed   By: Elige Ko   On: 06/14/2014 15:46     Today   Subjective:   Deanna Allison today has no headache,no chest abdominal pain,no new weakness tingling or numbness, feels much better wants to go home today.   Objective:   Blood pressure 117/57, pulse 91, temperature 99 F (37.2 C), temperature source Oral, resp. rate 16, height  (1.626 m), weight 85.9 kg (189 lb 6 oz), SpO2 95 %.  Intake/Output Summary (Last 24 hours) at 06/23/14 1130 Last data filed at 06/23/14 1114  Gross per 24 hour  Intake    240 ml  Output    300 ml  Net    -60 ml    Exam Awake Alert, Oriented *3, No new F.N deficits, Normal affect .AT,PERRAL Supple Neck,No JVD, No cervical lymphadenopathy appriciated.  Symmetrical Chest wall movement, Good air movement bilaterally, CTAB RRR,No Gallops,Rubs or new Murmurs, No Parasternal Heave +ve B.Sounds, Abd Soft, Non tender, No organomegaly appriciated, No rebound -guarding or rigidity. No Cyanosis, Clubbing or edema, No new Rash or bruise  Data Review  Cultures -  Results for orders placed or performed during the hospital encounter of 06/14/14  Urine culture     Status: None   Collection Time: 06/14/14 10:05 PM  Result Value Ref Range Status   Specimen Description URINE, CLEAN CATCH  Final   Special Requests NONE  Final   Colony Count   Final    >=100,000 COLONIES/ML Performed at [facility]    Culture   Final    Multiple bacterial morphotypes present, none predominant. Suggest appropriate recollection if clinically indicated. Performed at [facility]    Report Status 06/16/2014 FINAL  Final  Culture, blood (routine x 2)     Status: None   Collection Time: 06/15/14  8:10 AM  Result Value Ref Range Status    Specimen Description BLOOD LEFT ARM  Final   Special Requests BOTTLES DRAWN AEROBIC ONLY 5CC  Final   Culture   Final    NO GROWTH 5 DAYS Performed at [facility]    Report Status 06/21/2014 FINAL  Final  Culture, blood (routine x 2)     Status: None   Collection Time: 06/15/14  8:20 AM  Result Value Ref Range Status   Specimen Description BLOOD LEFT ARM  Final   Special Requests BOTTLES DRAWN AEROBIC AND ANAEROBIC 10CC  Final   Culture   Final    NO GROWTH 5 DAYS Performed at [facility]    Report Status 06/21/2014 FINAL  Final  Stool culture     Status: None   Collection Time: 06/16/14  2:06 PM  Result Value Ref Range Status   Specimen Description STOOL  Final   Special Requests NONE  Final   Culture   Final  MODERATE YEAST NO SALMONELLA, SHIGELLA, CAMPYLOBACTER, YERSINIA, OR E.COLI 0157:H7 ISOLATED Note: REDUCED NORMAL FLORA PRESENT Performed at [facility]    Report Status 06/20/2014 FINAL  Final  Clostridium Difficile by PCR     Status: Abnormal   Collection Time: 06/16/14  2:06 PM  Result Value Ref Range Status   C difficile by pcr POSITIVE (A) NEGATIVE Final    Comment: CRITICAL RESULT CALLED TO, READ BACK BY AND VERIFIED WITH: Alexia Freestone RN 17:15 06/16/14 ([email or web address])      CBC w Diff: Lab Results  Component Value Date   WBC 10.9* 06/21/2014   HGB 13.6 06/21/2014   HCT 43.0 06/21/2014   PLT 239 06/21/2014   LYMPHOPCT 5* 06/15/2014   MONOPCT 8 06/15/2014   EOSPCT 0 06/15/2014   BASOPCT 0 06/15/2014   CMP: Lab Results  Component Value Date   NA 139 06/22/2014   K 4.1 06/22/2014   CL 106 06/22/2014   CO2 28 06/22/2014   BUN 12 06/22/2014   CREATININE 0.92 06/22/2014   PROT 7.3 06/15/2014   ALBUMIN 3.3* 06/15/2014   BILITOT 0.6 06/15/2014   ALKPHOS 83 06/15/2014   AST 22 06/15/2014   ALT 14 06/15/2014  .  Micro Results Recent Results (from the past 240 hour(s))  Urine culture     Status: None   Collection Time:  06/14/14 10:05 PM  Result Value Ref Range Status   Specimen Description URINE, CLEAN CATCH  Final   Special Requests NONE  Final   Colony Count   Final    >=100,000 COLONIES/ML Performed at [facility]    Culture   Final    Multiple bacterial morphotypes present, none predominant. Suggest appropriate recollection if clinically indicated. Performed at [facility]    Report Status 06/16/2014 FINAL  Final  Culture, blood (routine x 2)     Status: None   Collection Time: 06/15/14  8:10 AM  Result Value Ref Range Status   Specimen Description BLOOD LEFT ARM  Final   Special Requests BOTTLES DRAWN AEROBIC ONLY 5CC  Final   Culture   Final    NO GROWTH 5 DAYS Performed at [facility]    Report Status 06/21/2014 FINAL  Final  Culture, blood (routine x 2)     Status: None   Collection Time: 06/15/14  8:20 AM  Result Value Ref Range Status   Specimen Description BLOOD LEFT ARM  Final   Special Requests BOTTLES DRAWN AEROBIC AND ANAEROBIC 10CC  Final   Culture   Final    NO GROWTH 5 DAYS Performed at [facility]    Report Status 06/21/2014 FINAL  Final  Stool culture     Status: None   Collection Time: 06/16/14  2:06 PM  Result Value Ref Range Status   Specimen Description STOOL  Final   Special Requests NONE  Final   Culture   Final    MODERATE YEAST NO SALMONELLA, SHIGELLA, CAMPYLOBACTER, YERSINIA, OR E.COLI 0157:H7 ISOLATED Note: REDUCED NORMAL FLORA PRESENT Performed at [facility]    Report Status 06/20/2014 FINAL  Final  Clostridium Difficile by PCR     Status: Abnormal   Collection Time: 06/16/14  2:06 PM  Result Value Ref Range Status   C difficile by pcr POSITIVE (A) NEGATIVE Final    Comment: CRITICAL RESULT CALLED TO, READ BACK BY AND VERIFIED WITH: Alexia Freestone RN 17:15 06/16/14 ([email or web address])      Discharge Instructions  Follow-up Information    Follow up with Angelica Chessman., MD. Schedule an appointment as soon  as possible for a visit in 1 week.   Specialty:  Family Medicine   Why:  Posthospitalization follow-up   Contact information:   [address] [place] [address] [phone]       Follow up with [facility].   Why:  hhpt, hhrn   Contact information:   [address] [place] [address] [phone]       Discharge Medications     Medication List    STOP taking these medications        omeprazole 40 MG capsule  Commonly known as:  PRILOSEC      TAKE these medications        brinzolamide 1 % ophthalmic suspension  Commonly known as:  AZOPT  Place 1 drop into both eyes 2 (two) times daily.     ciprofloxacin 500 MG tablet  Commonly known as:  CIPRO  Take 1 tablet (500 mg total) by mouth 2 (two) times daily.     FLUoxetine HCl 60 MG Tabs  Take 60 mg by mouth daily.     metroNIDAZOLE 500 MG tablet  Commonly known as:  FLAGYL  Take 1 tablet (500 mg total) by mouth 3 (three) times daily.     oxycodone 30 MG immediate release tablet  Commonly known as:  ROXICODONE  Take 0.5 tablets (15 mg total) by mouth every 6 (six) hours as needed for pain.     saccharomyces boulardii 250 MG capsule  Commonly known as:  FLORASTOR  Take 1 capsule (250 mg total) by mouth 2 (two) times daily.     verapamil 180 MG CR tablet  Commonly known as:  CALAN-SR  Take 180 mg by mouth daily.     zolpidem 5 MG tablet  Commonly known as:  AMBIEN  Take 2.5-5 mg by mouth at bedtime as needed for sleep.         Total Time in preparing paper work, data evaluation and todays exam - 35 minutes  Amillion Scobee M.D on 06/23/2014 at 11:30 AM  [facility] Office  [phone]

## 2014-06-23 NOTE — Discharge Instructions (Signed)
Follow with Primary MD Angelica ChessmanAGUIAR,RAFAELA M., MD in 7 days   Get CBC, CMP, 2 view Chest X ray checked  by Primary MD next visit.    Activity: As tolerated with Full fall precautions use walker/cane & assistance as needed   Disposition Home    Diet: Heart Healthy , with feeding assistance and aspiration precautions as needed.  For Heart failure patients - Check your Weight same time everyday, if you gain over 2 pounds, or you develop in leg swelling, experience more shortness of breath or chest pain, call your Primary MD immediately. Follow Cardiac Low Salt Diet and 1.5 lit/day fluid restriction.   On your next visit with your primary care physician please Get Medicines reviewed and adjusted.   Please request your Prim.MD to go over all Hospital Tests and Procedure/Radiological results at the follow up, please get all Hospital records sent to your Prim MD by signing hospital release before you go home.   If you experience worsening of your admission symptoms, develop shortness of breath, life threatening emergency, suicidal or homicidal thoughts you must seek medical attention immediately by calling 911 or calling your MD immediately  if symptoms less severe.  You Must read complete instructions/literature along with all the possible adverse reactions/side effects for all the Medicines you take and that have been prescribed to you. Take any new Medicines after you have completely understood and accpet all the possible adverse reactions/side effects.   Do not drive, operating heavy machinery, perform activities at heights, swimming or participation in water activities or provide baby sitting services if your were admitted for syncope or siezures until you have seen by Primary MD or a Neurologist and advised to do so again.  Do not drive when taking Pain medications.    Do not take more than prescribed Pain, Sleep and Anxiety Medications  Special Instructions: If you have smoked or chewed  Tobacco  in the last 2 yrs please stop smoking, stop any regular Alcohol  and or any Recreational drug use.  Wear Seat belts while driving.   Please note  You were cared for by a hospitalist during your hospital stay. If you have any questions about your discharge medications or the care you received while you were in the hospital after you are discharged, you can call the unit and asked to speak with the hospitalist on call if the hospitalist that took care of you is not available. Once you are discharged, your primary care physician will handle any further medical issues. Please note that NO REFILLS for any discharge medications will be authorized once you are discharged, as it is imperative that you return to your primary care physician (or establish a relationship with a primary care physician if you do not have one) for your aftercare needs so that they can reassess your need for medications and monitor your lab values.

## 2014-06-23 NOTE — Progress Notes (Signed)
Pt d/c completed, pt stable, no complaints or concerns stated.

## 2014-06-23 NOTE — Progress Notes (Addendum)
[facility]  [facility].  Soc will begin 24-48 hrs post dc.

## 2014-07-27 ENCOUNTER — Encounter: Payer: Self-pay | Admitting: Internal Medicine

## 2014-07-31 ENCOUNTER — Emergency Department (HOSPITAL_BASED_OUTPATIENT_CLINIC_OR_DEPARTMENT_OTHER): Payer: Medicare Other

## 2014-07-31 ENCOUNTER — Inpatient Hospital Stay (HOSPITAL_BASED_OUTPATIENT_CLINIC_OR_DEPARTMENT_OTHER)
Admission: EM | Admit: 2014-07-31 | Discharge: 2014-08-02 | DRG: 392 | Disposition: A | Discharge status: Home / Self Care | Payer: Medicare Other | Attending: Internal Medicine | Admitting: Internal Medicine

## 2014-07-31 ENCOUNTER — Encounter (HOSPITAL_BASED_OUTPATIENT_CLINIC_OR_DEPARTMENT_OTHER): Payer: Self-pay

## 2014-07-31 DIAGNOSIS — Z79899 Other long term (current) drug therapy: Secondary | ICD-10-CM | POA: Diagnosis not present

## 2014-07-31 DIAGNOSIS — D860 Sarcoidosis of lung: Secondary | ICD-10-CM | POA: Diagnosis present

## 2014-07-31 DIAGNOSIS — K5792 Diverticulitis of intestine, part unspecified, without perforation or abscess without bleeding: Secondary | ICD-10-CM | POA: Diagnosis present

## 2014-07-31 DIAGNOSIS — F1290 Cannabis use, unspecified, uncomplicated: Secondary | ICD-10-CM | POA: Diagnosis present

## 2014-07-31 DIAGNOSIS — G4733 Obstructive sleep apnea (adult) (pediatric): Secondary | ICD-10-CM | POA: Diagnosis present

## 2014-07-31 DIAGNOSIS — Z87891 Personal history of nicotine dependence: Secondary | ICD-10-CM | POA: Diagnosis not present

## 2014-07-31 DIAGNOSIS — K5732 Diverticulitis of large intestine without perforation or abscess without bleeding: Principal | ICD-10-CM | POA: Insufficient documentation

## 2014-07-31 DIAGNOSIS — N200 Calculus of kidney: Secondary | ICD-10-CM | POA: Diagnosis not present

## 2014-07-31 DIAGNOSIS — I10 Essential (primary) hypertension: Secondary | ICD-10-CM | POA: Diagnosis present

## 2014-07-31 DIAGNOSIS — R1032 Left lower quadrant pain: Secondary | ICD-10-CM | POA: Diagnosis present

## 2014-07-31 DIAGNOSIS — E119 Type 2 diabetes mellitus without complications: Secondary | ICD-10-CM | POA: Diagnosis present

## 2014-07-31 DIAGNOSIS — H9190 Unspecified hearing loss, unspecified ear: Secondary | ICD-10-CM | POA: Diagnosis present

## 2014-07-31 DIAGNOSIS — R109 Unspecified abdominal pain: Secondary | ICD-10-CM

## 2014-07-31 DIAGNOSIS — M797 Fibromyalgia: Secondary | ICD-10-CM | POA: Diagnosis present

## 2014-07-31 DIAGNOSIS — H269 Unspecified cataract: Secondary | ICD-10-CM | POA: Diagnosis present

## 2014-07-31 LAB — COMPREHENSIVE METABOLIC PANEL
ALT: 16 U/L (ref 0–35)
AST: 18 U/L (ref 0–37)
Albumin: 3.4 g/dL — ABNORMAL LOW (ref 3.5–5.2)
Alkaline Phosphatase: 77 U/L (ref 39–117)
Anion gap: 5 (ref 5–15)
BUN: 17 mg/dL (ref 6–23)
CALCIUM: 8.8 mg/dL (ref 8.4–10.5)
CO2: 30 mmol/L (ref 19–32)
Chloride: 105 mmol/L (ref 96–112)
Creatinine, Ser: 0.75 mg/dL (ref 0.50–1.10)
GFR calc Af Amer: >90 mL/min (ref >90)
GFR calc non Af Amer: 87 mL/min — ABNORMAL LOW (ref >90)
Glucose, Bld: 99 mg/dL (ref 70–99)
Potassium: 3.7 mmol/L (ref 3.5–5.1)
Sodium: 140 mmol/L (ref 135–145)
Total Bilirubin: 0.5 mg/dL (ref 0.3–1.2)
Total Protein: 6.6 g/dL (ref 6.0–8.3)

## 2014-07-31 LAB — GLUCOSE, CAPILLARY
GLUCOSE-CAPILLARY: 84 mg/dL (ref 70–99)
GLUCOSE-CAPILLARY: 72 mg/dL (ref 70–99)
GLUCOSE-CAPILLARY: 75 mg/dL (ref 70–99)

## 2014-07-31 LAB — CBC WITH DIFFERENTIAL/PLATELET
Basophils Absolute: 0 10*3/uL (ref 0.0–0.1)
Basophils Relative: 0 % (ref 0–1)
EOS ABS: 0.1 10*3/uL (ref 0.0–0.7)
Eosinophils Relative: 1 % (ref 0–5)
HCT: 40.3 % (ref 36.0–46.0)
Hemoglobin: 13.2 g/dL (ref 12.0–15.0)
Lymphocytes Relative: 15 % (ref 12–46)
Lymphs Abs: 0.8 10*3/uL (ref 0.7–4.0)
MCH: 27 pg (ref 26.0–34.0)
MCHC: 32.8 g/dL (ref 30.0–36.0)
MCV: 82.4 fL (ref 78.0–100.0)
MONOS PCT: 15 % — AB (ref 3–12)
Monocytes Absolute: 0.8 10*3/uL (ref 0.1–1.0)
NEUTROS PCT: 69 % (ref 43–77)
Neutro Abs: 3.4 10*3/uL (ref 1.7–7.7)
Platelets: 195 10*3/uL (ref 150–400)
RBC: 4.89 MIL/uL (ref 3.87–5.11)
RDW: 16.1 % — ABNORMAL HIGH (ref 11.5–15.5)
WBC: 5.1 10*3/uL (ref 4.0–10.5)

## 2014-07-31 LAB — URINALYSIS, ROUTINE W REFLEX MICROSCOPIC
Bilirubin Urine: NEGATIVE
GLUCOSE, UA: NEGATIVE mg/dL
Hgb urine dipstick: NEGATIVE
Ketones, ur: NEGATIVE mg/dL
NITRITE: NEGATIVE
Protein, ur: NEGATIVE mg/dL
Specific Gravity, Urine: 1.019 (ref 1.005–1.030)
Urobilinogen, UA: 1 mg/dL (ref 0.0–1.0)
pH: 7 (ref 5.0–8.0)

## 2014-07-31 LAB — URINE MICROSCOPIC-ADD ON

## 2014-07-31 LAB — TSH: TSH: 1.152 u[IU]/mL (ref 0.350–4.500)

## 2014-07-31 LAB — PROTIME-INR
INR: 1.21 (ref 0.00–1.49)
Prothrombin Time: 15.4 seconds — ABNORMAL HIGH (ref 11.6–15.2)

## 2014-07-31 LAB — I-STAT CG4 LACTIC ACID, ED: Lactic Acid, Venous: 0.5 mmol/L (ref 0.5–2.0)

## 2014-07-31 MED ORDER — CIPROFLOXACIN IN D5W 400 MG/200ML IV SOLN
400.0000 mg | Freq: Two times a day (BID) | INTRAVENOUS | Status: DC
  Administered 2014-07-31: 400 mg via INTRAVENOUS
  Filled 2014-07-31 (×2): qty 200

## 2014-07-31 MED ORDER — HYDROMORPHONE HCL 1 MG/ML IJ SOLN
1.0000 mg | INTRAMUSCULAR | Status: DC | PRN
  Administered 2014-07-31: 1 mg via INTRAVENOUS
  Filled 2014-07-31: qty 1

## 2014-07-31 MED ORDER — ACETAMINOPHEN 650 MG RE SUPP: 650.0000 mg | Freq: Four times a day (QID) | RECTAL | Status: DC | PRN

## 2014-07-31 MED ORDER — BRINZOLAMIDE 1 % OP SUSP
1.0000 [drp] | Freq: Two times a day (BID) | OPHTHALMIC | Status: DC
  Administered 2014-08-01 – 2014-08-02 (×2): 1 [drp] via OPHTHALMIC
  Filled 2014-07-31 (×2): qty 10

## 2014-07-31 MED ORDER — METOCLOPRAMIDE HCL 5 MG/ML IJ SOLN
10.0000 mg | Freq: Once | INTRAMUSCULAR | Status: AC
  Administered 2014-07-31: 10 mg via INTRAVENOUS
  Filled 2014-07-31: qty 2

## 2014-07-31 MED ORDER — ONDANSETRON HCL 4 MG/2ML IJ SOLN: 4.0000 mg | Freq: Three times a day (TID) | INTRAMUSCULAR | Status: DC | PRN

## 2014-07-31 MED ORDER — HEPARIN SODIUM (PORCINE) 5000 UNIT/ML IJ SOLN
5000.0000 [IU] | Freq: Three times a day (TID) | INTRAMUSCULAR | Status: DC
  Filled 2014-07-31 (×3): qty 1

## 2014-07-31 MED ORDER — SODIUM CHLORIDE 0.9 % IJ SOLN
3.0000 mL | Freq: Two times a day (BID) | INTRAMUSCULAR | Status: DC
  Administered 2014-08-01 – 2014-08-02 (×2): 3 mL via INTRAVENOUS

## 2014-07-31 MED ORDER — IOHEXOL 300 MG/ML  SOLN
100.0000 mL | Freq: Once | INTRAMUSCULAR | Status: AC | PRN
  Administered 2014-07-31: 100 mL via INTRAVENOUS

## 2014-07-31 MED ORDER — MORPHINE SULFATE 2 MG/ML IJ SOLN
1.0000 mg | INTRAMUSCULAR | Status: DC | PRN
  Administered 2014-07-31 – 2014-08-02 (×4): 1 mg via INTRAVENOUS
  Filled 2014-07-31 (×4): qty 1

## 2014-07-31 MED ORDER — FLUOXETINE HCL 20 MG PO CAPS
60.0000 mg | ORAL_CAPSULE | Freq: Every day | ORAL | Status: DC
  Administered 2014-08-01 – 2014-08-02 (×2): 60 mg via ORAL
  Filled 2014-07-31 (×4): qty 3

## 2014-07-31 MED ORDER — ACETAMINOPHEN 325 MG PO TABS
650.0000 mg | ORAL_TABLET | Freq: Four times a day (QID) | ORAL | Status: DC | PRN
  Administered 2014-08-01: 650 mg via ORAL
  Filled 2014-07-31: qty 2

## 2014-07-31 MED ORDER — ONDANSETRON HCL 4 MG/2ML IJ SOLN
4.0000 mg | Freq: Once | INTRAMUSCULAR | Status: AC
  Administered 2014-07-31: 4 mg via INTRAVENOUS
  Filled 2014-07-31: qty 2

## 2014-07-31 MED ORDER — SODIUM CHLORIDE 0.9 % IV BOLUS (SEPSIS)
500.0000 mL | Freq: Once | INTRAVENOUS | Status: AC
  Administered 2014-07-31: 500 mL via INTRAVENOUS

## 2014-07-31 MED ORDER — METRONIDAZOLE IN NACL 5-0.79 MG/ML-% IV SOLN
500.0000 mg | Freq: Three times a day (TID) | INTRAVENOUS | Status: DC
  Administered 2014-08-01 – 2014-08-02 (×4): 500 mg via INTRAVENOUS
  Filled 2014-07-31 (×7): qty 100

## 2014-07-31 MED ORDER — ONDANSETRON HCL 4 MG/2ML IJ SOLN: 4.0000 mg | Freq: Four times a day (QID) | INTRAMUSCULAR | Status: DC | PRN

## 2014-07-31 MED ORDER — VERAPAMIL HCL ER 180 MG PO TBCR
180.0000 mg | EXTENDED_RELEASE_TABLET | Freq: Every day | ORAL | Status: DC
  Administered 2014-08-01 – 2014-08-02 (×2): 180 mg via ORAL
  Filled 2014-07-31 (×3): qty 1

## 2014-07-31 MED ORDER — INSULIN ASPART 100 UNIT/ML ~~LOC~~ SOLN: 0.0000 [IU] | SUBCUTANEOUS | Status: DC

## 2014-07-31 MED ORDER — SACCHAROMYCES BOULARDII 250 MG PO CAPS
250.0000 mg | ORAL_CAPSULE | Freq: Two times a day (BID) | ORAL | Status: DC
  Administered 2014-07-31 – 2014-08-02 (×4): 250 mg via ORAL
  Filled 2014-07-31 (×5): qty 1

## 2014-07-31 MED ORDER — SODIUM CHLORIDE 0.9 % IV SOLN
INTRAVENOUS | Status: AC
  Administered 2014-07-31: 14:00:00 via INTRAVENOUS

## 2014-07-31 MED ORDER — METRONIDAZOLE IN NACL 5-0.79 MG/ML-% IV SOLN
500.0000 mg | Freq: Four times a day (QID) | INTRAVENOUS | Status: DC
  Filled 2014-07-31 (×3): qty 100

## 2014-07-31 MED ORDER — OXYCODONE HCL 5 MG PO TABS
15.0000 mg | ORAL_TABLET | Freq: Four times a day (QID) | ORAL | Status: DC | PRN
  Administered 2014-08-01 – 2014-08-02 (×2): 15 mg via ORAL
  Filled 2014-07-31 (×2): qty 3

## 2014-07-31 MED ORDER — METRONIDAZOLE IN NACL 5-0.79 MG/ML-% IV SOLN
500.0000 mg | Freq: Four times a day (QID) | INTRAVENOUS | Status: DC
  Administered 2014-07-31: 500 mg via INTRAVENOUS
  Filled 2014-07-31 (×4): qty 100

## 2014-07-31 MED ORDER — ONDANSETRON HCL 4 MG PO TABS: 4.0000 mg | ORAL_TABLET | Freq: Four times a day (QID) | ORAL | Status: DC | PRN

## 2014-07-31 MED ORDER — ZOLPIDEM TARTRATE 5 MG PO TABS
2.5000 mg | ORAL_TABLET | Freq: Every evening | ORAL | Status: DC | PRN
  Administered 2014-08-02: 5 mg via ORAL
  Filled 2014-07-31: qty 1

## 2014-07-31 MED ORDER — HYDROCODONE-ACETAMINOPHEN 5-325 MG PO TABS
1.0000 | ORAL_TABLET | ORAL | Status: DC | PRN
  Filled 2014-07-31: qty 2

## 2014-07-31 MED ORDER — SODIUM CHLORIDE 0.9 % IV SOLN
INTRAVENOUS | Status: DC
  Administered 2014-07-31 – 2014-08-01 (×2): via INTRAVENOUS

## 2014-07-31 MED ORDER — CIPROFLOXACIN IN D5W 400 MG/200ML IV SOLN
400.0000 mg | Freq: Two times a day (BID) | INTRAVENOUS | Status: DC
  Filled 2014-07-31: qty 200

## 2014-07-31 MED ORDER — CIPROFLOXACIN IN D5W 400 MG/200ML IV SOLN
400.0000 mg | Freq: Two times a day (BID) | INTRAVENOUS | Status: DC
  Administered 2014-08-01 – 2014-08-02 (×3): 400 mg via INTRAVENOUS
  Filled 2014-07-31 (×6): qty 200

## 2014-07-31 NOTE — H&P (Signed)
[facility] History and Physical  Deanna Allison ZOX:[id number] DOB: 03-23-1951 DOA: 07/31/2014  Referring physician: EDP PCP: Angelica Chessman., MD   Chief Complaint: LLQ abdominal pain  HPI: Deanna Allison is a 64 y.o. female with past medical history of pulmonary sarcoidosis, fibromyalgia she was recently treated for diverticulitis and C. difficile colitis for which she is describing as almost a full month of antibiotics. Patient did okay after her C. difficile episode and she said she even developed constipation because of the pain medication she is taking for the fibromyalgia. Since yesterday she developed LLQ pain associated with nausea but no vomiting, no diarrhea or other GI complaints. In the ED CT scan was done showed acute diverticulitis without abscess, patient admitted to the hospital for further evaluation.  Review of Systems:  Constitutional: negative for anorexia, fevers and sweats Eyes: negative for irritation, redness and visual disturbance Ears, nose, mouth, throat, and face: negative for earaches, epistaxis, nasal congestion and sore throat Respiratory: negative for cough, dyspnea on exertion, sputum and wheezing Cardiovascular: negative for chest pain, dyspnea, lower extremity edema, orthopnea, palpitations and syncope Gastrointestinal: Positive for LLQ abdominal pain and nausea. Genitourinary:negative for dysuria, frequency and hematuria Hematologic/lymphatic: negative for bleeding, easy bruising and lymphadenopathy Musculoskeletal:negative for arthralgias, muscle weakness and stiff joints Neurological: negative for coordination problems, gait problems, headaches and weakness Endocrine: negative for diabetic symptoms including polydipsia, polyuria and weight loss Allergic/Immunologic: negative for anaphylaxis, hay fever and urticaria  Past Medical History  Diagnosis Date  . Pulmonary sarcoidosis   . Fibromyalgia   . Heart valve disorder    trace AR, mild PR, mild to mod MR/TR, EF 60% 09/22/12 echo ([facility])  . Glaucoma   . Von Willebrand disease   . Diabetes mellitus without complication   . Cataracts, bilateral   . Diverticulitis   . Kidney stone   . PONV (postoperative nausea and vomiting)   . Hypertension     See [facility] at [facility]  . OSA (obstructive sleep apnea)     uses CPAP sleep study > 3 years ago  . Hearing loss of both ears    Past Surgical History  Procedure Laterality Date  . Cholecystectomy    . Appendectomy    . Abdominal hysterectomy    . Hernia repair    . Tibia fracture surgery    . Hand surgery    . Lung biopsy    . Multiple extractions with alveoloplasty N/A 03/18/2013    Procedure: MULTIPLE SURGICAL TEETH EXTRACTIONS;  Surgeon: Hinton Dyer, DDS;  Location: [facility] OR;  Service: Oral Surgery;  Laterality: N/A;   Social History:   reports that she quit smoking about 38 years ago. She does not have any smokeless tobacco history on file. She reports that she uses illicit drugs (Marijuana). She reports that she does not drink alcohol.  Allergies  Allergen Reactions  . Benadryl [Diphenhydramine Hcl (Sleep)]     Hallucinations   . Cymbalta [Duloxetine Hcl] Nausea And Vomiting  . Iodine Nausea And Vomiting  . Ivp Dye [Iodinated Diagnostic Agents]   . Lyrica [Pregabalin]     Depression  . Nsaids     Sits in my stomach  . Penicillins Hives  . Nickel Rash    Family history No family history of colon cancer  Prior to Admission medications   Medication Sig Start Date End Date Taking? Authorizing Provider  brinzolamide (AZOPT) 1 % ophthalmic suspension Place 1 drop into both eyes 2 (two) times daily.  Historical Provider, MD  FLUoxetine HCl 60 MG TABS Take 60 mg by mouth daily.    Historical Provider, MD  oxycodone (ROXICODONE) 30 MG immediate release tablet Take 0.5 tablets (15 mg total) by mouth every 6 (six) hours as needed for pain. 06/23/14   Leana Roe Elgergawy, MD    verapamil (CALAN-SR) 180 MG CR tablet Take 180 mg by mouth daily.    Historical Provider, MD  zolpidem (AMBIEN) 5 MG tablet Take 2.5-5 mg by mouth at bedtime as needed for sleep.     Historical Provider, MD   Physical Exam: Filed Vitals:   07/31/14 1505  BP: 140/79  Pulse: 72  Temp: 98.2 F (36.8 C)  Resp: 16   Constitutional: Oriented to person, place, and time. Well-developed and well-nourished. Cooperative.  Head: Normocephalic and atraumatic.  Nose: Nose normal.  Mouth/Throat: Uvula is midline, oropharynx is clear and moist and mucous membranes are normal.  Eyes: Conjunctivae and EOM are normal. Pupils are equal, round, and reactive to light.  Neck: Trachea normal and normal range of motion. Neck supple.  Cardiovascular: Normal rate, regular rhythm, S1 normal, S2 normal, normal heart sounds and intact distal pulses.   Pulmonary/Chest: Effort normal and breath sounds normal.  Abdominal: Soft. Bowel sounds are normal. There is no hepatosplenomegaly. There is no tenderness.  Musculoskeletal: Normal range of motion.  Neurological: Alert and oriented to person, place, and time. Has normal strength. No cranial nerve deficit or sensory deficit.  Skin: Skin is warm, dry and intact.  Psychiatric: Has a normal mood and affect. Speech is normal and behavior is normal.   Labs on Admission:  Basic Metabolic Panel:  Recent Labs Lab 07/31/14 1100  NA 140  K 3.7  CL 105  CO2 30  GLUCOSE 99  BUN 17  CREATININE 0.75  CALCIUM 8.8   Liver Function Tests:  Recent Labs Lab 07/31/14 1100  AST 18  ALT 16  ALKPHOS 77  BILITOT 0.5  PROT 6.6  ALBUMIN 3.4*   No results for input(s): LIPASE, AMYLASE in the last 168 hours. No results for input(s): AMMONIA in the last 168 hours. CBC:  Recent Labs Lab 07/31/14 1100  WBC 5.1  NEUTROABS 3.4  HGB 13.2  HCT 40.3  MCV 82.4  PLT 195   Cardiac Enzymes: No results for input(s): CKTOTAL, CKMB, CKMBINDEX, TROPONINI in the last 168  hours.  BNP (last 3 results) No results for input(s): BNP in the last 8760 hours.  ProBNP (last 3 results) No results for input(s): PROBNP in the last 8760 hours.  CBG:  Recent Labs Lab 07/31/14 1649  GLUCAP 84    Radiological Exams on Admission: Ct Abdomen Pelvis W Contrast  07/31/2014   CLINICAL DATA:  Left lower quadrant abdominal pain constipation for the past week. Nausea for the past 2 days. History of nephrolithiasis. Previous appendectomy, hysterectomy, cholecystectomy and hernia repair. Vomiting with intravenous contrast.  EXAM: CT ABDOMEN AND PELVIS WITH CONTRAST  TECHNIQUE: Multidetector CT imaging of the abdomen and pelvis was performed using the standard protocol following bolus administration of intravenous contrast.  CONTRAST:  OMNIPAQUE IOHEXOL 300 MG/ML  SOLN  COMPARISON:  06/14/2014.  FINDINGS: Cholecystectomy clips. Multiple colonic diverticula. Soft tissue stranding adjacent to the distal descending and proximal sigmoid colon in an area of multiple diverticula. No fluid collections or free peritoneal air.  Small ventral hernia at the level of the anterior pelvis on the right. Herniated fat extending into the subcutaneous fat. No herniated bowel.  Stable elevated right hemidiaphragm. The previously noted mildly enlarged celiac axis and retroperitoneal lymph nodes are unchanged. These include and anterior para-aortic node on the right measuring 10 mm in short axis diameter on image number 25, unchanged. No enlarged lymph nodes more inferiorly.  Normal appearing liver, spleen, pancreas, adrenal glands, kidneys and urinary bladder. No urinary tract calculi are seen today. Clear lung bases. Upper lumbar and lower thoracic spine degenerative changes.  IMPRESSION: 1. Distal descending and proximal sigmoid colon diverticulitis without abscess. 2. Large number of scratch extensive colonic diverticulosis. 3. Stable probable reactive upper abdominal and retroperitoneal lymph nodes.  4. Small right pelvic ventral hernia containing herniated fat.   Electronically Signed   By: Beckie SaltsSteven  Reid M.D.   On: 07/31/2014 13:14   Radiological images independently reviewed.  Assessment/Plan Principal Problem:   Acute diverticulitis Active Problems:   Essential hypertension   Right kidney stone    Acute diverticulitis Presented with LLQ abdominal pain and nausea without diarrhea. Recent history of acute diverticulitis with C. difficile infection in the same time in beginning of March. Patient will be on clear liquids for bowel rest. Started on antibiotics metronidazole and ciprofloxacin. Hopefully antibiotics only for 7-10 days as she just had prolonged antibiotics use, started probiotics as well.  Essential hypertension Patient is on verapamil, continued.  Fibromyalgia Patient takes 15 mg of OxyIR every 6 hours as needed for pain, restarted.  Code Status: Full code Family Communication: Plan discussed with the patient Disposition Plan: MedSurg  Time spent: 70 minutes  Irving Bloor A, MD [facility] Pager [phone]

## 2014-07-31 NOTE — Treatment Plan (Signed)
Called by Dr Chris Pollina  [age]year old female just discharged with acute diverticulitis and Cdiff colitis. She finished flagyl 2 weeks ago. Now presenting back to ED with lower abd pain for 1 week. CT abd in ED showed Distal descending and proximal sigmoid colon diverticulitiswithout abscess  Vital signs stable, accepted to med surg.   RAI,RIPUDEEP M.D. [facility] 07/31/2014, 1:41 PM  Pager: [phone]

## 2014-07-31 NOTE — ED Notes (Signed)
Pt presents w/ c/o abd pain w/ nausea, denies vomiting, onset yesterday

## 2014-07-31 NOTE — ED Notes (Signed)
Pt c/o Left Lower Abd pain, facial grimmacing noted intermittently, EDP informed of pt request for pain med

## 2014-07-31 NOTE — ED Notes (Signed)
Patient here from [facility] for further evaluation of LLQ pain with constipation x 1 week. Reports BM prior to arrival to this facility, no diarrhea. Reports nausea x 2 days, no vomiting. Recently finished treatment for C-diff.  Here for possible dehydration

## 2014-07-31 NOTE — ED Notes (Signed)
Patient transported to CT 

## 2014-07-31 NOTE — ED Provider Notes (Signed)
CSN: [id number]     Arrival date & time 07/31/14  1023 History   First MD Initiated Contact with Patient 07/31/14 1031     Chief Complaint  Patient presents with  . Abdominal Pain     (Consider location/radiation/quality/duration/timing/severity/associated sxs/prior Treatment) HPI Comments: Patient sent to the emergency department from urgent care for further evaluation. Patient reports that she has been experiencing pain in the lower abdomen for 1 week. Pain is mostly on the left side, described as sharp and stabbing. She thinks that she is experiencing constipation from her oxycodone. Patient reports that she has had decreased bowel movements, no diarrhea, no rectal bleeding. She has had nausea but no vomiting for the last 2 days. Patient was told that she might have dehydration as well as recurrent C. difficile or diverticulitis at urgent care, was referred to the ER. She did have diverticulitis 1 month ago that was, complicated by C. difficile. She finished her Flagyl 2 weeks ago.  Patient is a 64 y.o. female presenting with abdominal pain.  Abdominal Pain Associated symptoms: constipation   Associated symptoms: no fever     Past Medical History  Diagnosis Date  . Pulmonary sarcoidosis   . Fibromyalgia   . Heart valve disorder     trace AR, mild PR, mild to mod MR/TR, EF 60% 09/22/12 echo ([facility])  . Glaucoma   . Von Willebrand disease   . Diabetes mellitus without complication   . Cataracts, bilateral   . Diverticulitis   . Kidney stone   . PONV (postoperative nausea and vomiting)   . Hypertension     [facility]  . OSA (obstructive sleep apnea)     uses CPAP sleep study > 3 years ago  . Hearing loss of both ears    Past Surgical History  Procedure Laterality Date  . Cholecystectomy    . Appendectomy    . Abdominal hysterectomy    . Hernia repair    . Tibia fracture surgery    . Hand surgery    . Lung biopsy    . Multiple extractions with  alveoloplasty N/A 03/18/2013    Procedure: MULTIPLE SURGICAL TEETH EXTRACTIONS;  Surgeon: Hinton DyerJoseph L Miller, DDS;  Location: [facility] OR;  Service: Oral Surgery;  Laterality: N/A;   No family history on file. History  Substance Use Topics  . Smoking status: Former Smoker -- 0.50 packs/day for 16 years    Quit date: 04/08/1976  . Smokeless tobacco: Not on file  . Alcohol Use: No   OB History    No data available     Review of Systems  Constitutional: Negative for fever.  Gastrointestinal: Positive for abdominal pain and constipation.  All other systems reviewed and are negative.     Allergies  Benadryl; Cymbalta; Iodine; Ivp dye; Lyrica; Nsaids; Penicillins; and Nickel  Home Medications   Prior to Admission medications   Medication Sig Start Date End Date Taking? Authorizing Provider  brinzolamide (AZOPT) 1 % ophthalmic suspension Place 1 drop into both eyes 2 (two) times daily.     Historical Provider, MD  FLUoxetine HCl 60 MG TABS Take 60 mg by mouth daily.    Historical Provider, MD  oxycodone (ROXICODONE) 30 MG immediate release tablet Take 0.5 tablets (15 mg total) by mouth every 6 (six) hours as needed for pain. 06/23/14   Leana Roeawood S Elgergawy, MD  verapamil (CALAN-SR) 180 MG CR tablet Take 180 mg by mouth daily.    Historical Provider, MD  zolpidem (AMBIEN) 5 MG tablet Take 2.5-5 mg by mouth at bedtime as needed for sleep.     Historical Provider, MD   BP 143/68 mmHg  Pulse 88  Temp(Src) 98.9 F (37.2 C) (Oral)  Resp 20  Ht  (1.626 m)  Wt 184 lb (83.462 kg)  BMI 31.57 kg/m2  SpO2 98% Physical Exam  Constitutional: She is oriented to person, place, and time. She appears well-developed and well-nourished. No distress.  HENT:  Head: Normocephalic and atraumatic.  Right Ear: Hearing normal.  Left Ear: Hearing normal.  Nose: Nose normal.  Mouth/Throat: Oropharynx is clear and moist and mucous membranes are normal.  Eyes: Conjunctivae and EOM are normal. Pupils are  equal, round, and reactive to light.  Neck: Normal range of motion. Neck supple.  Cardiovascular: Regular rhythm, S1 normal and S2 normal.  Exam reveals no gallop and no friction rub.   No murmur heard. Pulmonary/Chest: Effort normal and breath sounds normal. No respiratory distress. She exhibits no tenderness.  Abdominal: Soft. Normal appearance and bowel sounds are normal. There is no hepatosplenomegaly. There is tenderness in the right lower quadrant, suprapubic area and left lower quadrant. There is no rebound, no guarding, no tenderness at McBurney's point and negative Murphy's sign. No hernia.  Musculoskeletal: Normal range of motion.  Neurological: She is alert and oriented to person, place, and time. She has normal strength. No cranial nerve deficit or sensory deficit. Coordination normal. GCS eye subscore is 4. GCS verbal subscore is 5. GCS motor subscore is 6.  Skin: Skin is warm, dry and intact. No rash noted. No cyanosis.  Psychiatric: She has a normal mood and affect. Her speech is normal and behavior is normal. Thought content normal.  Nursing note and vitals reviewed.   ED Course  Procedures (including critical care time) Labs Review Labs Reviewed  CBC WITH DIFFERENTIAL/PLATELET - Abnormal; Notable for the following:    RDW 16.1 (*)    Monocytes Relative 15 (*)    All other components within normal limits  COMPREHENSIVE METABOLIC PANEL - Abnormal; Notable for the following:    Albumin 3.4 (*)    GFR calc non Af Amer 87 (*)    All other components within normal limits  URINALYSIS, ROUTINE W REFLEX MICROSCOPIC - Abnormal; Notable for the following:    Leukocytes, UA SMALL (*)    All other components within normal limits  URINE MICROSCOPIC-ADD ON - Abnormal; Notable for the following:    Bacteria, UA MANY (*)    All other components within normal limits  I-STAT CG4 LACTIC ACID, ED    Imaging Review Ct Abdomen Pelvis W Contrast  07/31/2014   CLINICAL DATA:  Left lower  quadrant abdominal pain constipation for the past week. Nausea for the past 2 days. History of nephrolithiasis. Previous appendectomy, hysterectomy, cholecystectomy and hernia repair. Vomiting with intravenous contrast.  EXAM: CT ABDOMEN AND PELVIS WITH CONTRAST  TECHNIQUE: Multidetector CT imaging of the abdomen and pelvis was performed using the standard protocol following bolus administration of intravenous contrast.  CONTRAST:  OMNIPAQUE IOHEXOL 300 MG/ML  SOLN  COMPARISON:  06/14/2014.  FINDINGS: Cholecystectomy clips. Multiple colonic diverticula. Soft tissue stranding adjacent to the distal descending and proximal sigmoid colon in an area of multiple diverticula. No fluid collections or free peritoneal air.  Small ventral hernia at the level of the anterior pelvis on the right. Herniated fat extending into the subcutaneous fat. No herniated bowel.  Stable elevated right hemidiaphragm. The previously noted  mildly enlarged celiac axis and retroperitoneal lymph nodes are unchanged. These include and anterior para-aortic node on the right measuring 10 mm in short axis diameter on image number 25, unchanged. No enlarged lymph nodes more inferiorly.  Normal appearing liver, spleen, pancreas, adrenal glands, kidneys and urinary bladder. No urinary tract calculi are seen today. Clear lung bases. Upper lumbar and lower thoracic spine degenerative changes.  IMPRESSION: 1. Distal descending and proximal sigmoid colon diverticulitis without abscess. 2. Large number of scratch extensive colonic diverticulosis. 3. Stable probable reactive upper abdominal and retroperitoneal lymph nodes. 4. Small right pelvic ventral hernia containing herniated fat.   Electronically Signed   By: Beckie Salts M.D.   On: 07/31/2014 13:14     EKG Interpretation None      MDM   Final diagnoses:  Left sided abdominal pain  Diverticulitis of large intestine without perforation or abscess without bleeding    Patient presents  to the ER for evaluation of abdominal pain. Patient reports that she had acute diverticulitis one month ago. She was hospitalized for treatment and had concomitant C. difficile colitis. Patient finished her Flagyl approximately 2 weeks ago. She has now been experiencing pain for the last week that is progressively worsening. Patient with generalized lower abdominal tenderness, no obvious peritonitis at this time. Because of her recent history, CT scan was performed to evaluate for complications of diverticulitis such as abscess and perforation. She has evidence of distal descending and proximal sigmoid colon diverticulitis without abscess or respiration. This is concerning in that she just had an episode of month ago and is recurrent. With the recent C. difficile colitis, I do not feel the patient is appropriate for outpatient management. Will admit the patient.    Gilda Crease, MD 07/31/14 1328

## 2014-08-01 DIAGNOSIS — M797 Fibromyalgia: Secondary | ICD-10-CM | POA: Insufficient documentation

## 2014-08-01 DIAGNOSIS — K5732 Diverticulitis of large intestine without perforation or abscess without bleeding: Secondary | ICD-10-CM | POA: Insufficient documentation

## 2014-08-01 LAB — CBC
HEMATOCRIT: 38.8 % (ref 36.0–46.0)
HEMOGLOBIN: 12.5 g/dL (ref 12.0–15.0)
MCH: 26.8 pg (ref 26.0–34.0)
MCHC: 32.2 g/dL (ref 30.0–36.0)
MCV: 83.1 fL (ref 78.0–100.0)
Platelets: 181 10*3/uL (ref 150–400)
RBC: 4.67 MIL/uL (ref 3.87–5.11)
RDW: 16 % — AB (ref 11.5–15.5)
WBC: 4.9 10*3/uL (ref 4.0–10.5)

## 2014-08-01 LAB — GLUCOSE, CAPILLARY
GLUCOSE-CAPILLARY: 85 mg/dL (ref 70–99)
GLUCOSE-CAPILLARY: 107 mg/dL — AB (ref 70–99)
GLUCOSE-CAPILLARY: 83 mg/dL (ref 70–99)
Glucose-Capillary: 78 mg/dL (ref 70–99)
Glucose-Capillary: 72 mg/dL (ref 70–99)
Glucose-Capillary: 80 mg/dL (ref 70–99)

## 2014-08-01 LAB — BASIC METABOLIC PANEL
Anion gap: 8 (ref 5–15)
BUN: 8 mg/dL (ref 6–23)
CALCIUM: 8.5 mg/dL (ref 8.4–10.5)
CO2: 28 mmol/L (ref 19–32)
Chloride: 102 mmol/L (ref 96–112)
Creatinine, Ser: 0.75 mg/dL (ref 0.50–1.10)
GFR calc Af Amer: >90 mL/min (ref >90)
GFR calc non Af Amer: 87 mL/min — ABNORMAL LOW (ref >90)
GLUCOSE: 81 mg/dL (ref 70–99)
POTASSIUM: 3.8 mmol/L (ref 3.5–5.1)
Sodium: 138 mmol/L (ref 135–145)

## 2014-08-01 NOTE — Progress Notes (Signed)
[facility] PROGRESS NOTE  Pascal LuxLynn M Bishop-McKenzie WJX:[id number] DOB: 06/30/1950 DOA: 07/31/2014 PCP: Angelica ChessmanAGUIAR,RAFAELA M., MD  Assessment/Plan: #1 acute diverticulitis This is patient's second episode of diverticulitis. Patient with no emesis no nausea or abdominal pain has improved. Patient with no diarrhea. Will advance diet to a full liquid diet. Continue IV Flagyl and IV ciprofloxacin. Supportive care. IV fluids. Patient only to follow-up with GI as outpatient.  #2 hypertension Currently stable. Continue verapamil. Follow.  #3 fibromyalgia Continue current pain regimen.  #4 prophylaxis Heparin for DVT prophylaxis.   Code Status: Full Family Communication: Updated patient. No family at bedside. Disposition Plan: Home when tolerating oral intake and abdominal pain has resolved hopefully 1-2 days.   Consultants:  None  Procedures:  CT abdomen and pelvis 07/31/2014  Antibiotics:  IV ciprofloxacin 07/31/2014  IV Flagyl 07/31/2014   HPI/Subjective: Patient denies any nausea, no emesis. Patient denies any abdominal pain. Patient asking for food.  Objective: Filed Vitals:   08/01/14 0524  BP: 143/74  Pulse: 78  Temp: 98.4 F (36.9 C)  Resp: 16    Intake/Output Summary (Last 24 hours) at 08/01/14 1139 Last data filed at 07/31/14 2120  Gross per 24 hour  Intake    240 ml  Output      0 ml  Net    240 ml   Filed Weights   07/31/14 1027  Weight: 83.462 kg (184 lb)    Exam:   General:  NAD  Cardiovascular: RRR  Respiratory: CTAB  Abdomen: Soft, nontender, nondistended, positive bowel sounds.  Musculoskeletal: No clubbing cyanosis or edema.  Data Reviewed: Basic Metabolic Panel:  Recent Labs Lab 07/31/14 1100 08/01/14 0437  NA 140 138  K 3.7 3.8  CL 105 102  CO2 30 28  GLUCOSE 99 81  BUN 17 8  CREATININE 0.75 0.75  CALCIUM 8.8 8.5   Liver Function Tests:  Recent Labs Lab 07/31/14 1100  AST 18  ALT 16  ALKPHOS 77  BILITOT 0.5   PROT 6.6  ALBUMIN 3.4*   No results for input(s): LIPASE, AMYLASE in the last 168 hours. No results for input(s): AMMONIA in the last 168 hours. CBC:  Recent Labs Lab 07/31/14 1100 08/01/14 0437  WBC 5.1 4.9  NEUTROABS 3.4  --   HGB 13.2 12.5  HCT 40.3 38.8  MCV 82.4 83.1  PLT 195 181   Cardiac Enzymes: No results for input(s): CKTOTAL, CKMB, CKMBINDEX, TROPONINI in the last 168 hours. BNP (last 3 results) No results for input(s): BNP in the last 8760 hours.  ProBNP (last 3 results) No results for input(s): PROBNP in the last 8760 hours.  CBG:  Recent Labs Lab 07/31/14 2121 07/31/14 2147 08/01/14 0127 08/01/14 0411 08/01/14 0802  GLUCAP 72 75 78 85 107*    No results found for this or any previous visit (from the past 240 hour(s)).   Studies: Ct Abdomen Pelvis W Contrast  07/31/2014   CLINICAL DATA:  Left lower quadrant abdominal pain constipation for the past week. Nausea for the past 2 days. History of nephrolithiasis. Previous appendectomy, hysterectomy, cholecystectomy and hernia repair. Vomiting with intravenous contrast.  EXAM: CT ABDOMEN AND PELVIS WITH CONTRAST  TECHNIQUE: Multidetector CT imaging of the abdomen and pelvis was performed using the standard protocol following bolus administration of intravenous contrast.  CONTRAST:  100mL OMNIPAQUE IOHEXOL 300 MG/ML  SOLN  COMPARISON:  06/14/2014.  FINDINGS: Cholecystectomy clips. Multiple colonic diverticula. Soft tissue stranding adjacent to the distal descending and proximal sigmoid  colon in an area of multiple diverticula. No fluid collections or free peritoneal air.  Small ventral hernia at the level of the anterior pelvis on the right. Herniated fat extending into the subcutaneous fat. No herniated bowel.  Stable elevated right hemidiaphragm. The previously noted mildly enlarged celiac axis and retroperitoneal lymph nodes are unchanged. These include and anterior para-aortic node on the right measuring 10 mm in  short axis diameter on image number 25, unchanged. No enlarged lymph nodes more inferiorly.  Normal appearing liver, spleen, pancreas, adrenal glands, kidneys and urinary bladder. No urinary tract calculi are seen today. Clear lung bases. Upper lumbar and lower thoracic spine degenerative changes.  IMPRESSION: 1. Distal descending and proximal sigmoid colon diverticulitis without abscess. 2. Large number of scratch extensive colonic diverticulosis. 3. Stable probable reactive upper abdominal and retroperitoneal lymph nodes. 4. Small right pelvic ventral hernia containing herniated fat.   Electronically Signed   By: Beckie Salts M.D.   On: 07/31/2014 13:14    Scheduled Meds: . brinzolamide  1 drop Both Eyes BID  . ciprofloxacin  400 mg Intravenous Q12H  . FLUoxetine  60 mg Oral Daily  . heparin  5,000 Units Subcutaneous 3 times per day  . insulin aspart  0-9 Units Subcutaneous 6 times per day  . metronidazole  500 mg Intravenous Q8H  . saccharomyces boulardii  250 mg Oral BID  . sodium chloride  3 mL Intravenous Q12H  . verapamil  180 mg Oral Daily   Continuous Infusions: . sodium chloride 100 mL/hr at 08/01/14 4098    Principal Problem:   Acute diverticulitis Active Problems:   Essential hypertension   Right kidney stone    Time spent: 40 mins    [facility] MD [facility] Pager [phone]. If 7PM-7AM, please contact night-coverage at [email or web address], password [email or web address] 08/01/2014, 11:39 AM  LOS: 1 day

## 2014-08-01 NOTE — Progress Notes (Signed)
Utilization review completed.  

## 2014-08-01 NOTE — Progress Notes (Signed)
[facility] NOte:  Home machine and places self on/off cpap.

## 2014-08-02 LAB — CBC
HCT: 40.4 % (ref 36.0–46.0)
Hemoglobin: 13 g/dL (ref 12.0–15.0)
MCH: 26.5 pg (ref 26.0–34.0)
MCHC: 32.2 g/dL (ref 30.0–36.0)
MCV: 82.3 fL (ref 78.0–100.0)
Platelets: 190 10*3/uL (ref 150–400)
RBC: 4.91 MIL/uL (ref 3.87–5.11)
RDW: 15.7 % — AB (ref 11.5–15.5)
WBC: 4.6 10*3/uL (ref 4.0–10.5)

## 2014-08-02 LAB — GLUCOSE, CAPILLARY
GLUCOSE-CAPILLARY: 76 mg/dL (ref 70–99)
GLUCOSE-CAPILLARY: 82 mg/dL (ref 70–99)
Glucose-Capillary: 77 mg/dL (ref 70–99)

## 2014-08-02 LAB — BASIC METABOLIC PANEL
Anion gap: 9 (ref 5–15)
BUN: 6 mg/dL (ref 6–23)
CALCIUM: 9.1 mg/dL (ref 8.4–10.5)
CO2: 27 mmol/L (ref 19–32)
Chloride: 105 mmol/L (ref 96–112)
Creatinine, Ser: 0.75 mg/dL (ref 0.50–1.10)
GFR calc Af Amer: >90 mL/min (ref >90)
GFR calc non Af Amer: 87 mL/min — ABNORMAL LOW (ref >90)
Glucose, Bld: 63 mg/dL — ABNORMAL LOW (ref 70–99)
Potassium: 3.6 mmol/L (ref 3.5–5.1)
Sodium: 141 mmol/L (ref 135–145)

## 2014-08-02 LAB — HEMOGLOBIN A1C
HEMOGLOBIN A1C: 5.9 % — AB (ref 4.8–5.6)
Mean Plasma Glucose: 123 mg/dL

## 2014-08-02 MED ORDER — METRONIDAZOLE 500 MG PO TABS
500.0000 mg | ORAL_TABLET | Freq: Three times a day (TID) | ORAL | Status: DC
  Administered 2014-08-02: 500 mg via ORAL
  Filled 2014-08-02: qty 1

## 2014-08-02 MED ORDER — CIPROFLOXACIN HCL 500 MG PO TABS: 500.0000 mg | ORAL_TABLET | Freq: Two times a day (BID) | ORAL | Status: DC

## 2014-08-02 MED ORDER — CIPROFLOXACIN HCL 500 MG PO TABS
500.0000 mg | ORAL_TABLET | Freq: Two times a day (BID) | ORAL | Status: DC
Start: 2014-08-02 — End: 2016-12-03

## 2014-08-02 MED ORDER — DOCUSATE SODIUM 100 MG PO CAPS: 100.0000 mg | ORAL_CAPSULE | Freq: Two times a day (BID) | ORAL | Status: DC

## 2014-08-02 MED ORDER — OXYCODONE HCL 20 MG PO TABS: 1.0000 | ORAL_TABLET | Freq: Four times a day (QID) | ORAL | Status: DC | PRN

## 2014-08-02 MED ORDER — METRONIDAZOLE 500 MG PO TABS: 500.0000 mg | ORAL_TABLET | Freq: Three times a day (TID) | ORAL | Status: DC

## 2014-08-02 NOTE — Plan of Care (Signed)
Problem: Food- and Nutrition-Related Knowledge Deficit (NB-1.1) Goal: Nutrition education Formal process to instruct or train a patient/client in a skill or to impart knowledge to help patients/clients voluntarily manage or modify food choices and eating behavior to maintain or improve health. Outcome: Completed/Met Date Met:  08/02/14 RD consulted for a diet education regarding diverticulitis.   Pt was given "Low Fiber Nutrition Therapy" handout from the Academy of Nutrition and Dietetics Manual. Pt was recommended to start with 6-10 grams of fiber per day and then to increase to standard recommendations of 20-35 grams of fiber per day. A list of foods recommended and not recommended were reviewed. Encouraged adequate protein intake. Teach back method used.  Expect good compliance.   Labs and medications reviewed. Pt to be discharged today.  Kallie Locks, MS, RD, LDN Pager # [phone] After hours/ weekend pager # [phone]

## 2014-08-02 NOTE — Discharge Summary (Signed)
Physician Discharge Summary  Deanna LuxLynn M Allison QIO:[id number] DOB: 07/14/1950 DOA: 07/31/2014  PCP: Deanna Allison  Admit date: 07/31/2014 Discharge date: 08/02/2014  Time spent: 65 minutes  Recommendations for Outpatient Follow-up:  1. Follow-up with Dr. Marina GoodellPerry of gastroenterology as scheduled.  2. Follow-up with Deanna Allison as scheduled.  Discharge Diagnoses:  Principal Problem:   Acute diverticulitis Active Problems:   Essential hypertension   Right kidney stone   Diverticulitis of large intestine without perforation or abscess without bleeding   Fibromyalgia   Discharge Condition: Stable and improved  Diet recommendation: Soft low fiber diet.  Filed Weights   07/31/14 1027  Weight: 83.462 kg (184 lb)    History of present illness:  Deanna Allison is a 64 y.o. female with past medical history of pulmonary sarcoidosis, fibromyalgia she was recently treated for diverticulitis and C. difficile colitis for which she was describing as almost a full month of antibiotics. Patient did okay after her C. difficile episode and she said she even developed constipation because of the pain medication she is taking for the fibromyalgia. Since one day prior to admission, she developed LLQ pain associated with nausea but no vomiting, no diarrhea or other GI complaints. In the ED CT scan was done showed acute diverticulitis without abscess, patient admitted to the hospital for further evaluation.   Hospital Course:  #1 acute diverticulitis This was patient's second episode of diverticulitis. Patient with no emesis no nausea during the hospitalization and her abdominal pain had improved. Patient was initially on a clear liquid diet advanced to a full liquid diet which she tolerated and subsequently to a soft diet which she tolerated. Patient was placed on IV ciprofloxacin and IV Flagyl and then transitioned to oral ciprofloxacin and oral Flagyl. Patient was on IV  fluids. Patient improved clinically. Patient remained afebrile with a normal white count. Patient will be discharged home on normal days of oral ciprofloxacin and Flagyl and patient will follow-up with GI, Dr. Marina GoodellPerry as scheduled. Patient will be discharged home in stable and improved condition.  #2 hypertension Currently stable. Continued on home regimen of verapamil. Follow.  #3 fibromyalgia Continued on home pain regimen.   Procedures:  CT abdomen and pelvis 07/31/2014  Consultations:  None  Discharge Exam: Filed Vitals:   08/02/14 0950  BP: 167/82  Pulse:   Temp:   Resp:     General: NAD Cardiovascular: RRR Respiratory: CTAB GI/ABD: Soft/NT/ND/+BS/Obese  Discharge Instructions   Discharge Instructions    Diet general    Complete by:  As directed   Soft/low fiber diet     Discharge instructions    Complete by:  As directed   Follow up with Dr Marina GoodellPerry as scheduled. Follow up with Deanna Allison as scheduled.     Increase activity slowly    Complete by:  As directed           Current Discharge Medication List    START taking these medications   Details  ciprofloxacin (CIPRO) 500 MG tablet Take 1 tablet (500 mg total) by mouth 2 (two) times daily. Take for 9 days then stop. Qty: 18 tablet, Refills: 0    docusate sodium (COLACE) 100 MG capsule Take 1 capsule (100 mg total) by mouth 2 (two) times daily. Qty: 10 capsule, Refills: 0    metroNIDAZOLE (FLAGYL) 500 MG tablet Take 1 tablet (500 mg total) by mouth every 8 (eight) hours. Take for 9 days then stop. Qty: 27 tablet, Refills: 0  CONTINUE these medications which have CHANGED   Details  Oxycodone HCl 20 MG TABS Take 1 tablet (20 mg total) by mouth every 6 (six) hours as needed (pain). Qty: 15 tablet, Refills: 0      CONTINUE these medications which have NOT CHANGED   Details  acetaminophen (TYLENOL) 325 MG tablet Take 650 mg by mouth every 6 (six) hours as needed for mild pain.     dorzolamide (TRUSOPT) 2 % ophthalmic solution Place 1 drop into both eyes 2 (two) times daily.    Probiotic Product (PROBIOTIC DAILY PO) Take 1 tablet by mouth daily.    FLUoxetine HCl 60 MG TABS Take 60 mg by mouth daily.    verapamil (CALAN-SR) 180 MG CR tablet Take 180 mg by mouth daily.    zolpidem (AMBIEN) 5 MG tablet Take 2.5-5 mg by mouth at bedtime as needed for sleep.       STOP taking these medications     brinzolamide (AZOPT) 1 % ophthalmic suspension        Allergies  Allergen Reactions  . Benadryl [Diphenhydramine Hcl (Sleep)]     Hallucinations   . Cymbalta [Duloxetine Hcl] Nausea And Vomiting  . Iodine Nausea And Vomiting  . Ivp Dye [Iodinated Diagnostic Agents]   . Lyrica [Pregabalin]     Depression  . Nsaids     Sits in my stomach  . Penicillins Hives  . Nickel Rash   Follow-up Information    Follow up with Deanna Chessman., Allison.   Specialty:  Family Medicine   Why:  f/u as scheduled.   Contact information:   [address] [place] [address] [phone]       Follow up with Yancey Flemings, Allison.   Specialty:  Gastroenterology   Why:  f/u as scheduled   Contact information:   [address] [place] [address] [phone]        The results of significant diagnostics from this hospitalization (including imaging, microbiology, ancillary and laboratory) are listed below for reference.    Significant Diagnostic Studies: Ct Abdomen Pelvis W Contrast  07/31/2014   CLINICAL DATA:  Left lower quadrant abdominal pain constipation for the past week. Nausea for the past 2 days. History of nephrolithiasis. Previous appendectomy, hysterectomy, cholecystectomy and hernia repair. Vomiting with intravenous contrast.  EXAM: CT ABDOMEN AND PELVIS WITH CONTRAST  TECHNIQUE: Multidetector CT imaging of the abdomen and pelvis was performed using the standard protocol following bolus administration of intravenous contrast.  CONTRAST:  OMNIPAQUE  IOHEXOL 300 MG/ML  SOLN  COMPARISON:  06/14/2014.  FINDINGS: Cholecystectomy clips. Multiple colonic diverticula. Soft tissue stranding adjacent to the distal descending and proximal sigmoid colon in an area of multiple diverticula. No fluid collections or free peritoneal air.  Small ventral hernia at the level of the anterior pelvis on the right. Herniated fat extending into the subcutaneous fat. No herniated bowel.  Stable elevated right hemidiaphragm. The previously noted mildly enlarged celiac axis and retroperitoneal lymph nodes are unchanged. These include and anterior para-aortic node on the right measuring 10 mm in short axis diameter on image number 25, unchanged. No enlarged lymph nodes more inferiorly.  Normal appearing liver, spleen, pancreas, adrenal glands, kidneys and urinary bladder. No urinary tract calculi are seen today. Clear lung bases. Upper lumbar and lower thoracic spine degenerative changes.  IMPRESSION: 1. Distal descending and proximal sigmoid colon diverticulitis without abscess. 2. Large number of scratch extensive colonic diverticulosis. 3. Stable probable reactive upper  abdominal and retroperitoneal lymph nodes. 4. Small right pelvic ventral hernia containing herniated fat.   Electronically Signed   By: Beckie Salts AllisonD.   On: 07/31/2014 13:14    Microbiology: No results found for this or any previous visit (from the past 240 hour(s)).   Labs: Basic Metabolic Panel:  Recent Labs Lab 07/31/14 1100 08/01/14 0437 08/02/14 0620  NA 140 138 141  K 3.7 3.8 3.6  CL 105 102 105  CO2 GLUCOSE 99 81 63*  BUN CREATININE 0.75 0.75 0.75  CALCIUM 8.8 8.5 9.1   Liver Function Tests:  Recent Labs Lab 07/31/14 1100  AST 18  ALT 16  ALKPHOS 77  BILITOT 0.5  PROT 6.6  ALBUMIN 3.4*   No results for input(s): LIPASE, AMYLASE in the last 168 hours. No results for input(s): AMMONIA in the last 168 hours. CBC:  Recent Labs Lab 07/31/14 1100  08/01/14 0437 08/02/14 0620  WBC 5.1 4.9 4.6  NEUTROABS 3.4  --   --   HGB 13.2 12.5 13.0  HCT 40.3 38.8 40.4  MCV 82.4 83.1 82.3  PLT 195 181 190   Cardiac Enzymes: No results for input(s): CKTOTAL, CKMB, CKMBINDEX, TROPONINI in the last 168 hours. BNP: BNP (last 3 results) No results for input(s): BNP in the last 8760 hours.  ProBNP (last 3 results) No results for input(s): PROBNP in the last 8760 hours.  CBG:  Recent Labs Lab 08/01/14 1201 08/01/14 1630 08/01/14 2058 08/01/14 2352 08/02/14 0358  GLUCAP 72 83 80 76 77       Signed:  Katana Berthold Allison [facility] 08/02/2014, 11:05 AM

## 2014-08-24 ENCOUNTER — Telehealth: Payer: Self-pay

## 2014-08-24 NOTE — Telephone Encounter (Signed)
-----   Message from Hilarie FredricksonJohn N Perry, MD sent at 08/22/2014  3:38 PM EDT ----- I can't accommodate patients with established GI doctors, currently. Hard for me to accommodate my patients, currently. Sorry. She should return to [facility]. Thanks ----- Message -----    From: Jeanine LuzLeslie K Vladislav Axelson, CMA    Sent: 08/22/2014   3:05 PM      To: Hilarie FredricksonJohn N Perry, MD  You saw the above patient in the hospital for diverticulitis in March of this year.  In your note you state that patient will follow up with her regular outpatient [facility].  Patient wants to come back here and see you.  Are you willing to see her if we get records first or would you prefer we just refer [facility] as [facility].  Please advise.

## 2014-09-28 ENCOUNTER — Ambulatory Visit: Payer: Medicare Other | Admitting: Internal Medicine

## 2015-01-23 ENCOUNTER — Other Ambulatory Visit: Payer: Self-pay | Admitting: Gastroenterology

## 2015-01-23 DIAGNOSIS — Z86010 Personal history of colonic polyps: Secondary | ICD-10-CM

## 2015-01-23 DIAGNOSIS — Z800 Family history of malignant neoplasm of digestive organs: Secondary | ICD-10-CM

## 2015-02-06 ENCOUNTER — Inpatient Hospital Stay
Admission: RE | Admit: 2015-02-06 | Discharge: 2015-02-06 | Disposition: A | Discharge status: Home / Self Care | Payer: Medicare Other | Source: Ambulatory Visit | Attending: Gastroenterology | Admitting: Gastroenterology

## 2015-09-12 ENCOUNTER — Emergency Department (HOSPITAL_BASED_OUTPATIENT_CLINIC_OR_DEPARTMENT_OTHER)
Admission: EM | Admit: 2015-09-12 | Discharge: 2015-09-12 | Disposition: A | Discharge status: Home / Self Care | Payer: Medicare Other | Attending: Emergency Medicine | Admitting: Emergency Medicine

## 2015-09-12 ENCOUNTER — Encounter (HOSPITAL_BASED_OUTPATIENT_CLINIC_OR_DEPARTMENT_OTHER): Payer: Self-pay | Admitting: *Deleted

## 2015-09-12 DIAGNOSIS — Z79899 Other long term (current) drug therapy: Secondary | ICD-10-CM | POA: Diagnosis not present

## 2015-09-12 DIAGNOSIS — I10 Essential (primary) hypertension: Secondary | ICD-10-CM | POA: Insufficient documentation

## 2015-09-12 DIAGNOSIS — Z87891 Personal history of nicotine dependence: Secondary | ICD-10-CM | POA: Insufficient documentation

## 2015-09-12 DIAGNOSIS — R197 Diarrhea, unspecified: Secondary | ICD-10-CM | POA: Insufficient documentation

## 2015-09-12 DIAGNOSIS — E119 Type 2 diabetes mellitus without complications: Secondary | ICD-10-CM | POA: Diagnosis not present

## 2015-09-12 DIAGNOSIS — R112 Nausea with vomiting, unspecified: Secondary | ICD-10-CM | POA: Diagnosis not present

## 2015-09-12 DIAGNOSIS — Z7984 Long term (current) use of oral hypoglycemic drugs: Secondary | ICD-10-CM | POA: Insufficient documentation

## 2015-09-12 LAB — COMPREHENSIVE METABOLIC PANEL
ALK PHOS: 74 U/L (ref 38–126)
ALT: 19 U/L (ref 14–54)
ANION GAP: 9 (ref 5–15)
AST: 29 U/L (ref 15–41)
Albumin: 3.9 g/dL (ref 3.5–5.0)
BILIRUBIN TOTAL: 0.7 mg/dL (ref 0.3–1.2)
BUN: 14 mg/dL (ref 6–20)
CALCIUM: 9.3 mg/dL (ref 8.9–10.3)
CO2: 28 mmol/L (ref 22–32)
Chloride: 104 mmol/L (ref 101–111)
Creatinine, Ser: 0.84 mg/dL (ref 0.44–1.00)
GFR calc non Af Amer: >60 mL/min (ref >60)
GLUCOSE: 90 mg/dL (ref 65–99)
Potassium: 3.8 mmol/L (ref 3.5–5.1)
Sodium: 141 mmol/L (ref 135–145)
TOTAL PROTEIN: 7.6 g/dL (ref 6.5–8.1)

## 2015-09-12 LAB — LIPASE, BLOOD: Lipase: 20 U/L (ref 11–51)

## 2015-09-12 LAB — URINALYSIS, ROUTINE W REFLEX MICROSCOPIC
BILIRUBIN URINE: NEGATIVE
Glucose, UA: NEGATIVE mg/dL
HGB URINE DIPSTICK: NEGATIVE
KETONES UR: 15 mg/dL — AB
NITRITE: NEGATIVE
PH: 6 (ref 5.0–8.0)
Protein, ur: NEGATIVE mg/dL
Specific Gravity, Urine: 1.018 (ref 1.005–1.030)

## 2015-09-12 LAB — CBC WITH DIFFERENTIAL/PLATELET
Basophils Absolute: 0 10*3/uL (ref 0.0–0.1)
Basophils Relative: 0 %
Eosinophils Absolute: 0.1 10*3/uL (ref 0.0–0.7)
Eosinophils Relative: 1 %
HEMATOCRIT: 45.3 % (ref 36.0–46.0)
HEMOGLOBIN: 14.8 g/dL (ref 12.0–15.0)
LYMPHS ABS: 0.7 10*3/uL (ref 0.7–4.0)
LYMPHS PCT: 15 %
MCH: 28.2 pg (ref 26.0–34.0)
MCHC: 32.7 g/dL (ref 30.0–36.0)
MCV: 86.5 fL (ref 78.0–100.0)
MONOS PCT: 11 %
Monocytes Absolute: 0.5 10*3/uL (ref 0.1–1.0)
NEUTROS ABS: 3.5 10*3/uL (ref 1.7–7.7)
NEUTROS PCT: 73 %
Platelets: 212 10*3/uL (ref 150–400)
RBC: 5.24 MIL/uL — ABNORMAL HIGH (ref 3.87–5.11)
RDW: 13.5 % (ref 11.5–15.5)
WBC: 4.8 10*3/uL (ref 4.0–10.5)

## 2015-09-12 LAB — URINE MICROSCOPIC-ADD ON: RBC / HPF: NONE SEEN RBC/hpf (ref 0–5)

## 2015-09-12 MED ORDER — SODIUM CHLORIDE 0.9 % IV BOLUS (SEPSIS)
1000.0000 mL | Freq: Once | INTRAVENOUS | Status: AC
  Administered 2015-09-12: 1000 mL via INTRAVENOUS

## 2015-09-12 MED ORDER — ONDANSETRON HCL 4 MG/2ML IJ SOLN
4.0000 mg | Freq: Once | INTRAMUSCULAR | Status: AC
  Administered 2015-09-12: 4 mg via INTRAVENOUS
  Filled 2015-09-12: qty 2

## 2015-09-12 MED ORDER — ONDANSETRON HCL 4 MG PO TABS: 4.0000 mg | ORAL_TABLET | Freq: Three times a day (TID) | ORAL | Status: DC | PRN

## 2015-09-12 MED FILL — ONDANSETRON HCL 4 MG TABLET: 4 | 4 days supply | Qty: 12 | Fill #0

## 2015-09-12 NOTE — Discharge Instructions (Signed)
Return for worsening symptoms, including severe abdominal pain, fever, passing out, vomiting and unable to keep down food/fluids despite nausea medications, concerns for worsening dehydration, or any other symptoms concerning to you.   We did test your stool for c. Diff. Results may take a full day to return. We will call you if this test is positive. Do not take anti-diarrheal medications until c. Diff testing is negative.  Diarrhea Diarrhea is frequent loose and watery bowel movements. It can cause you to feel weak and dehydrated. Dehydration can cause you to become tired and thirsty, have a dry mouth, and have decreased urination that often is dark yellow. Diarrhea is a sign of another problem, most often an infection that will not last long. In most cases, diarrhea typically lasts 2-3 days. However, it can last longer if it is a sign of something more serious. It is important to treat your diarrhea as directed by your caregiver to lessen or prevent future episodes of diarrhea. CAUSES  Some common causes include:  Gastrointestinal infections caused by viruses, bacteria, or parasites.  Food poisoning or food allergies.  Certain medicines, such as antibiotics, chemotherapy, and laxatives.  Artificial sweeteners and fructose.  Digestive disorders. HOME CARE INSTRUCTIONS  Ensure adequate fluid intake (hydration): Have 1 cup (8 oz) of fluid for each diarrhea episode. Avoid fluids that contain simple sugars or sports drinks, fruit juices, whole milk products, and sodas. Your urine should be clear or pale yellow if you are drinking enough fluids. Hydrate with an oral rehydration solution that you can purchase at pharmacies, retail stores, and online. You can prepare an oral rehydration solution at home by mixing the following ingredients together:   - tsp table salt.   tsp baking soda.   tsp salt substitute containing potassium chloride.  1  tablespoons sugar.  1 L (34 oz) of  water.  Certain foods and beverages may increase the speed at which food moves through the gastrointestinal (GI) tract. These foods and beverages should be avoided and include:  Caffeinated and alcoholic beverages.  High-fiber foods, such as raw fruits and vegetables, nuts, seeds, and whole grain breads and cereals.  Foods and beverages sweetened with sugar alcohols, such as xylitol, sorbitol, and mannitol.  Some foods may be well tolerated and may help thicken stool including:  Starchy foods, such as rice, toast, pasta, low-sugar cereal, oatmeal, grits, baked potatoes, crackers, and bagels.  Bananas.  Applesauce.  Add probiotic-rich foods to help increase healthy bacteria in the GI tract, such as yogurt and fermented milk products.  Wash your hands well after each diarrhea episode.  Only take over-the-counter or prescription medicines as directed by your caregiver.  Take a warm bath to relieve any burning or pain from frequent diarrhea episodes. SEEK IMMEDIATE MEDICAL CARE IF:   You are unable to keep fluids down.  You have persistent vomiting.  You have blood in your stool, or your stools are black and tarry.  You do not urinate in 6-8 hours, or there is only a small amount of very dark urine.  You have abdominal pain that increases or localizes.  You have weakness, dizziness, confusion, or light-headedness.  You have a severe headache.  Your diarrhea gets worse or does not get better.  You have a fever or persistent symptoms for more than 2-3 days.  You have a fever and your symptoms suddenly get worse. MAKE SURE YOU:   Understand these instructions.  Will watch your condition.  Will get help right  away if you are not doing well or get worse.   This information is not intended to replace advice given to you by your health care provider. Make sure you discuss any questions you have with your health care provider.   Document Released: 03/15/2002 Document  Revised: 04/15/2014 Document Reviewed: 12/01/2011 Elsevier Interactive Patient Education 2016 [employer]  Nausea and Vomiting Nausea is a sick feeling that often comes before throwing up (vomiting). Vomiting is a reflex where stomach contents come out of your mouth. Vomiting can cause severe loss of body fluids (dehydration). Children and elderly adults can become dehydrated quickly, especially if they also have diarrhea. Nausea and vomiting are symptoms of a condition or disease. It is important to find the cause of your symptoms. CAUSES   Direct irritation of the stomach lining. This irritation can result from increased acid production (gastroesophageal reflux disease), infection, food poisoning, taking certain medicines (such as nonsteroidal anti-inflammatory drugs), alcohol use, or tobacco use.  Signals from the brain.These signals could be caused by a headache, heat exposure, an inner ear disturbance, increased pressure in the brain from injury, infection, a tumor, or a concussion, pain, emotional stimulus, or metabolic problems.  An obstruction in the gastrointestinal tract (bowel obstruction).  Illnesses such as diabetes, hepatitis, gallbladder problems, appendicitis, kidney problems, cancer, sepsis, atypical symptoms of a heart attack, or eating disorders.  Medical treatments such as chemotherapy and radiation.  Receiving medicine that makes you sleep (general anesthetic) during surgery. DIAGNOSIS Your caregiver may ask for tests to be done if the problems do not improve after a few days. Tests may also be done if symptoms are severe or if the reason for the nausea and vomiting is not clear. Tests may include:  Urine tests.  Blood tests.  Stool tests.  Cultures (to look for evidence of infection).  X-rays or other imaging studies. Test results can help your caregiver make decisions about treatment or the need for additional tests. TREATMENT You need to stay well hydrated.  Drink frequently but in small amounts.You may wish to drink water, sports drinks, clear broth, or eat frozen ice pops or gelatin dessert to help stay hydrated.When you eat, eating slowly may help prevent nausea.There are also some antinausea medicines that may help prevent nausea. HOME CARE INSTRUCTIONS   Take all medicine as directed by your caregiver.  If you do not have an appetite, do not force yourself to eat. However, you must continue to drink fluids.  If you have an appetite, eat a normal diet unless your caregiver tells you differently.  Eat a variety of complex carbohydrates (rice, wheat, potatoes, bread), lean meats, yogurt, fruits, and vegetables.  Avoid high-fat foods because they are more difficult to digest.  Drink enough water and fluids to keep your urine clear or pale yellow.  If you are dehydrated, ask your caregiver for specific rehydration instructions. Signs of dehydration may include:  Severe thirst.  Dry lips and mouth.  Dizziness.  Dark urine.  Decreasing urine frequency and amount.  Confusion.  Rapid breathing or pulse. SEEK IMMEDIATE MEDICAL CARE IF:   You have blood or brown flecks (like coffee grounds) in your vomit.  You have black or bloody stools.  You have a severe headache or stiff neck.  You are confused.  You have severe abdominal pain.  You have chest pain or trouble breathing.  You do not urinate at least once every 8 hours.  You develop cold or clammy skin.  You continue to  vomit for longer than 24 to 48 hours.  You have a fever. MAKE SURE YOU:   Understand these instructions.  Will watch your condition.  Will get help right away if you are not doing well or get worse.   This information is not intended to replace advice given to you by your health care provider. Make sure you discuss any questions you have with your health care provider.   Document Released: 03/25/2005 Document Revised: 06/17/2011 Document  Reviewed: 08/22/2010 Elsevier Interactive Patient Education [employer]

## 2015-09-12 NOTE — ED Notes (Signed)
Sprite and crackers given per request. 

## 2015-09-12 NOTE — ED Notes (Signed)
Abdominal pain since yesterday. Vomiting and diarrhea today.

## 2015-09-12 NOTE — ED Provider Notes (Signed)
CSN: [id number]     Arrival date & time 09/12/15  1130 History   First MD Initiated Contact with Patient 09/12/15 1139     Chief Complaint  Patient presents with  . Abdominal Pain     (Consider location/radiation/quality/duration/timing/severity/associated sxs/prior Treatment) HPI 65 year old female who presents with nausea, vomiting, and diarrhea. She has a history of pulmonary sarcoidosis, fibromyalgia, diabetes. She also has history of C. difficile colitis, diverticulitis, cholecystectomy, and appendectomy in the past. States that she has otherwise been in her usual state of health and yesterday had multiple episodes of nonbloody diarrhea. States initially stools were loose, but has transitioned to being very watery with flecks of stool. Had vomiting starting this morning, nonbloody and nonbilious in nature. Does complain of intermittent low abdominal cramping, but denies any abdominal pain currently. No fevers or chills. Dysuria, urinary frequency. No recent traveling, known sick contacts, or recent antibiotics. No chest pain or sob. Past Medical History  Diagnosis Date  . Pulmonary sarcoidosis (HCC)   . Fibromyalgia   . Heart valve disorder     trace AR, mild PR, mild to mod MR/TR, EF 60% 09/22/12 echo ([facility])  . Glaucoma   . Von Willebrand disease (HCC)   . Diabetes mellitus without complication (HCC)   . Cataracts, bilateral   . Diverticulitis   . Kidney stone   . PONV (postoperative nausea and vomiting)   . Hypertension     [facility]  . OSA (obstructive sleep apnea)     uses CPAP sleep study > 3 years ago  . Hearing loss of both ears    Past Surgical History  Procedure Laterality Date  . Cholecystectomy    . Appendectomy    . Abdominal hysterectomy    . Hernia repair    . Tibia fracture surgery    . Hand surgery    . Lung biopsy    . Multiple extractions with alveoloplasty N/A 03/18/2013    Procedure: MULTIPLE SURGICAL TEETH EXTRACTIONS;   Surgeon: Hinton DyerJoseph L Miller, DDS;  Location: [facility] OR;  Service: Oral Surgery;  Laterality: N/A;  . Back surgery     History reviewed. No pertinent family history. Social History  Substance Use Topics  . Smoking status: Former Smoker -- 0.50 packs/day for 16 years    Quit date: 04/08/1976  . Smokeless tobacco: None  . Alcohol Use: No   OB History    No data available     Review of Systems 10/14 systems reviewed and are negative other than those stated in the HPI    Allergies  Benadryl; Cymbalta; Iodine; Ivp dye; Lyrica; Nsaids; Penicillins; and Nickel  Home Medications   Prior to Admission medications   Medication Sig Start Date End Date Taking? Authorizing Provider  acetaminophen (TYLENOL) 325 MG tablet Take 650 mg by mouth every 6 (six) hours as needed for mild pain.    Historical Provider, MD  ciprofloxacin (CIPRO) 500 MG tablet Take 1 tablet (500 mg total) by mouth 2 (two) times daily. Take for 9 days then stop. 08/02/14   Rodolph Bonganiel Thompson V, MD  docusate sodium (COLACE) 100 MG capsule Take 1 capsule (100 mg total) by mouth 2 (two) times daily. 08/02/14   Rodolph Bonganiel Thompson V, MD  dorzolamide (TRUSOPT) 2 % ophthalmic solution Place 1 drop into both eyes 2 (two) times daily.    Historical Provider, MD  FLUoxetine HCl 60 MG TABS Take 60 mg by mouth daily.    Historical Provider, MD  metroNIDAZOLE (  FLAGYL) 500 MG tablet Take 1 tablet (500 mg total) by mouth every 8 (eight) hours. Take for 9 days then stop. 08/02/14   Rodolph Bong, MD  ondansetron (ZOFRAN) 4 MG tablet Take 1 tablet (4 mg total) by mouth every 8 (eight) hours as needed for nausea or vomiting. 09/12/15   Lavera Guise, MD  Oxycodone HCl 20 MG TABS Take 1 tablet (20 mg total) by mouth every 6 (six) hours as needed (pain). 08/02/14   Rodolph Bong, MD  Probiotic Product (PROBIOTIC DAILY PO) Take 1 tablet by mouth daily.    Historical Provider, MD  verapamil (CALAN-SR) 180 MG CR tablet Take 180 mg by mouth daily.    Historical  Provider, MD  zolpidem (AMBIEN) 5 MG tablet Take 2.5-5 mg by mouth at bedtime as needed for sleep.     Historical Provider, MD   BP 137/66 mmHg  Pulse 80  Temp(Src) 98.9 F (37.2 C) (Oral)  Resp 18  Ht 5\' 4"  (1.626 m)  Wt 198 lb (89.812 kg)  BMI 33.97 kg/m2  SpO2 94% Physical Exam Physical Exam  Nursing note and vitals reviewed. Constitutional: Well developed, well nourished, non-toxic, and in no acute distress Head: Normocephalic and atraumatic.  Mouth/Throat: Oropharynx is clear and mucous membranes appear dry.  Neck: Normal range of motion. Neck supple.  Cardiovascular: Normal rate and regular rhythm.  No edema. Pulmonary/Chest: Effort normal and breath sounds normal.  Abdominal: Soft. There is no tenderness. There is no rebound and no guarding.  Musculoskeletal: Normal range of motion.  Neurological: Alert, no facial droop, fluent speech, moves all extremities symmetrically Skin: Skin is warm and dry.  Psychiatric: Cooperative  ED Course  Procedures (including critical care time) Labs Review Labs Reviewed  CBC WITH DIFFERENTIAL/PLATELET - Abnormal; Notable for the following:    RBC 5.24 (*)    All other components within normal limits  URINALYSIS, ROUTINE W REFLEX MICROSCOPIC (NOT AT [facility]) - Abnormal; Notable for the following:    Ketones, ur 15 (*)    Leukocytes, UA SMALL (*)    All other components within normal limits  URINE MICROSCOPIC-ADD ON - Abnormal; Notable for the following:    Squamous Epithelial / LPF 0-5 (*)    Bacteria, UA MANY (*)    All other components within normal limits  C DIFFICILE QUICK SCREEN W PCR REFLEX  COMPREHENSIVE METABOLIC PANEL  LIPASE, BLOOD    Imaging Review No results found. I have personally reviewed and evaluated these images and lab results as part of my medical decision-making.   EKG Interpretation None      MDM   Final diagnoses:  Nausea vomiting and diarrhea    65 year old female who presents with 1 day of  nausea, vomiting, and diarrhea. Afebrile and hemodynamically stable. She is nontoxic in no acute distress but does appear dry on exam. No abdominal tenderness noted. Suspect benign GI illness and given IV fluids with antiemetics. Does have history of C. difficile colitis, which increases her risk for recurrence. We'll obtain stool sample and test for C. difficile. I will obtain basic blood work.   C difficile testing obtained and pending. Basic blood work unremarkable. Without significant leukocytosis, electrolyte or metabolic derangements. Given IVF and antiemetics. Feels improved and able to tolerate fluids and crackers. Will discharged home with antiemetics and close follow-up with PCP. Strict return and follow-up instructions reviewed. She expressed understanding of all discharge instructions and felt comfortable with the plan of care.  Lavera Guise, MD 09/12/15 [phone]

## 2015-09-13 LAB — C DIFFICILE QUICK SCREEN W PCR REFLEX
C DIFFICLE (CDIFF) ANTIGEN: POSITIVE — AB
C Diff toxin: NEGATIVE

## 2015-09-14 LAB — CLOSTRIDIUM DIFFICILE BY PCR: Toxigenic C. Difficile by PCR: NEGATIVE

## 2016-03-17 IMAGING — CT CT ABD-PELV W/ CM
2 of 5 series · 16 of 46 positions shown, 18 images · IV contrast (APPLIED)
Comparison: 09/07/2012

CLINICAL DATA: Pain around the umbilical area and pain radiating to
the back. Nausea and vomiting. All starting today.

EXAM:
CT ABDOMEN AND PELVIS WITH CONTRAST
TECHNIQUE: Multidetector CT imaging of the abdomen and pelvis was performed
using the standard protocol following bolus administration of
intravenous contrast.
CONTRAST:  50mL OMNIPAQUE IOHEXOL 300 MG/ML SOLN, 100mL OMNIPAQUE
IOHEXOL 300 MG/ML SOLN

[Series 3: abd/pelvis 5.0 b31f · axial · 0.92mm/px · z∈[+912,+1348]mm · 13 of 99 slices shown, 15 images]
[im 6/99  soft-tissue]
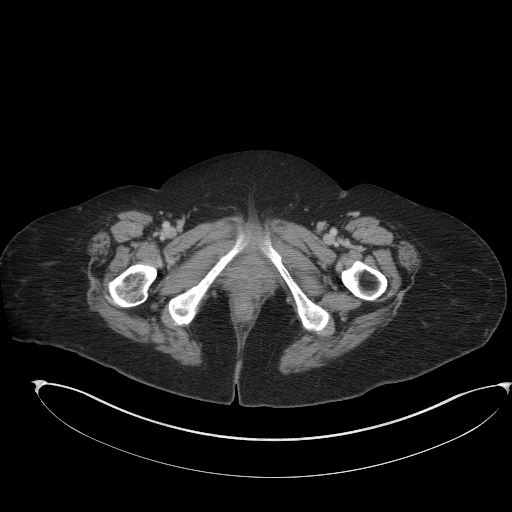
[im 6/99  bone]
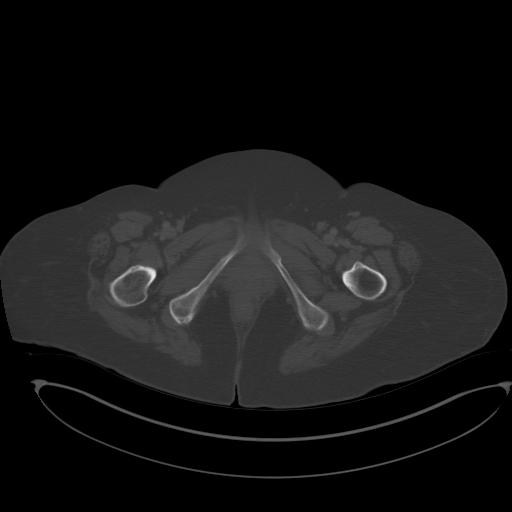
[im 11/99  soft-tissue]
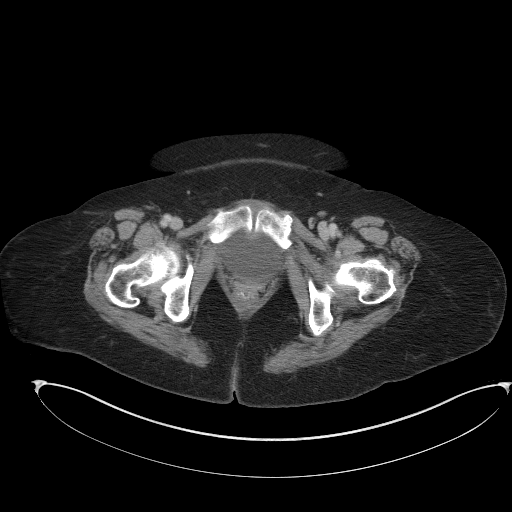
[im 22/99  soft-tissue]
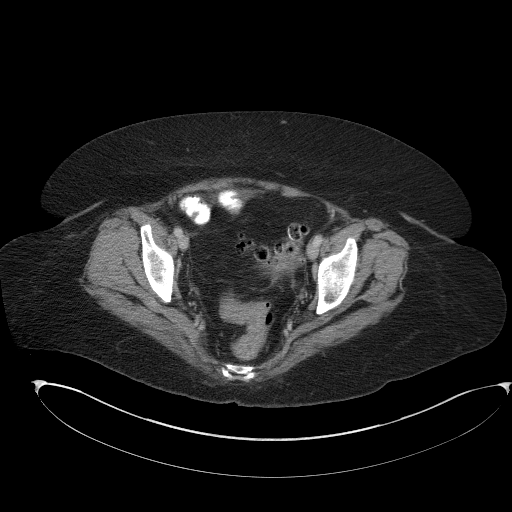
[im 28/99  soft-tissue]
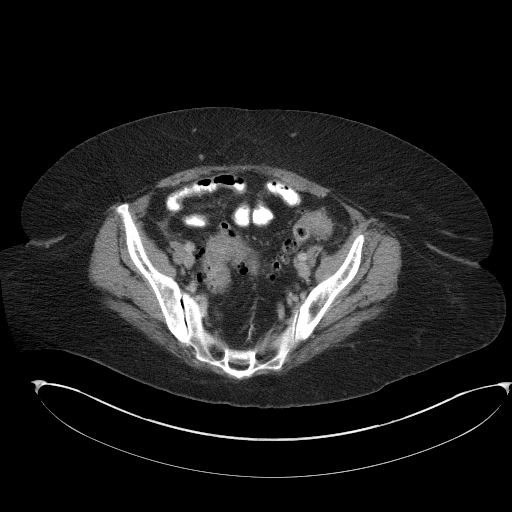
[im 33/99  soft-tissue]
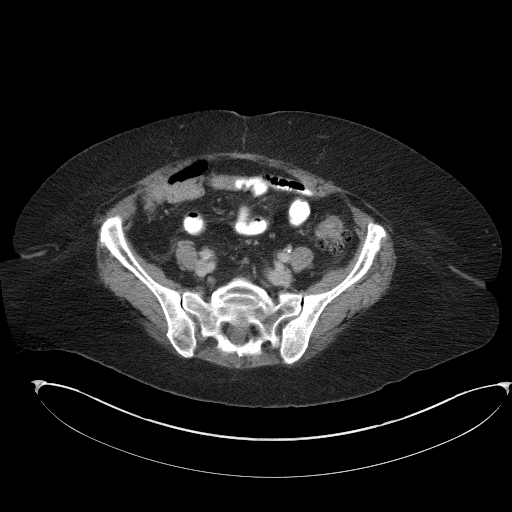
[im 44/99  soft-tissue]
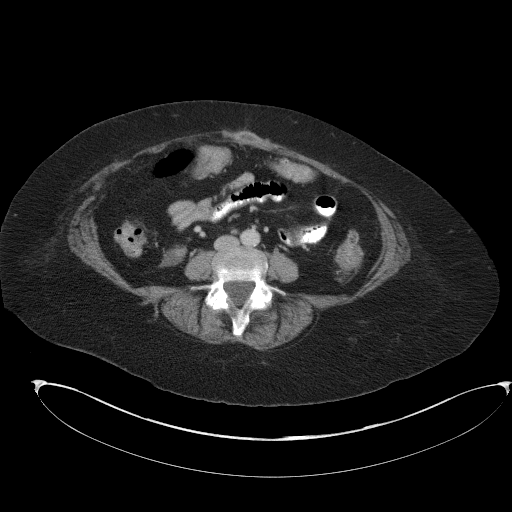
[im 50/99  soft-tissue]
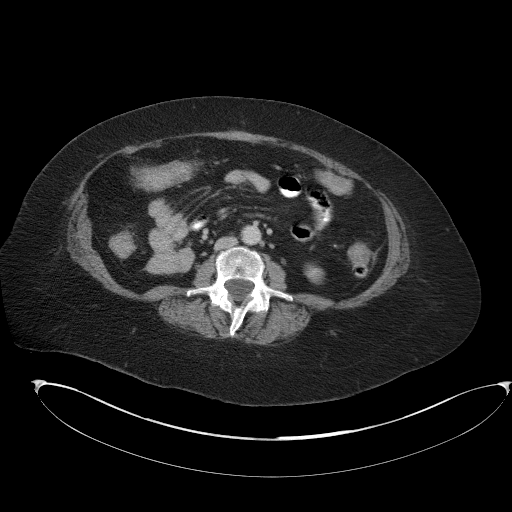
[im 55/99  soft-tissue]
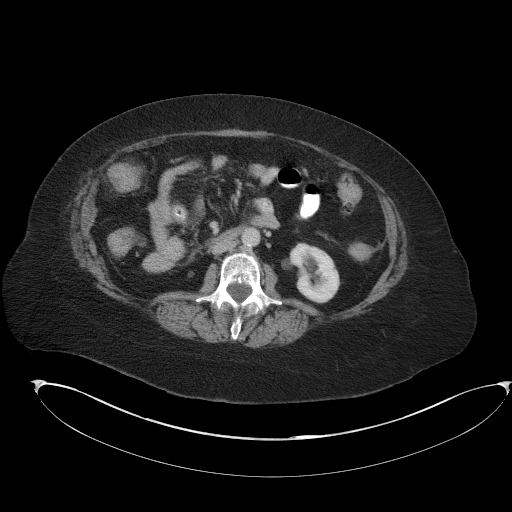
[im 66/99  soft-tissue]
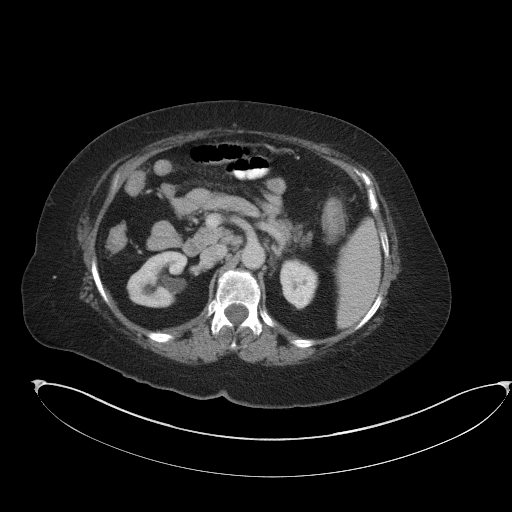
[im 66/99  bone]
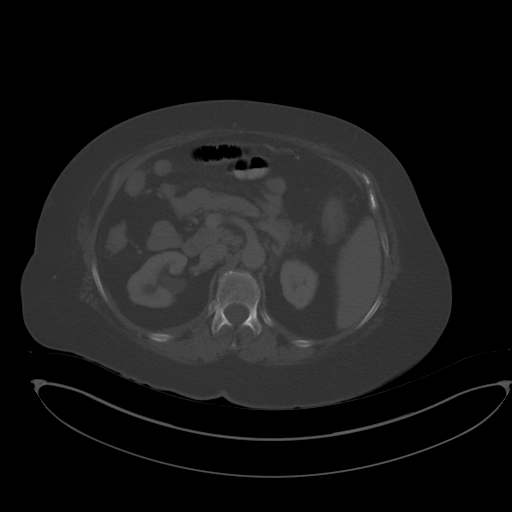
[im 71/99  soft-tissue]
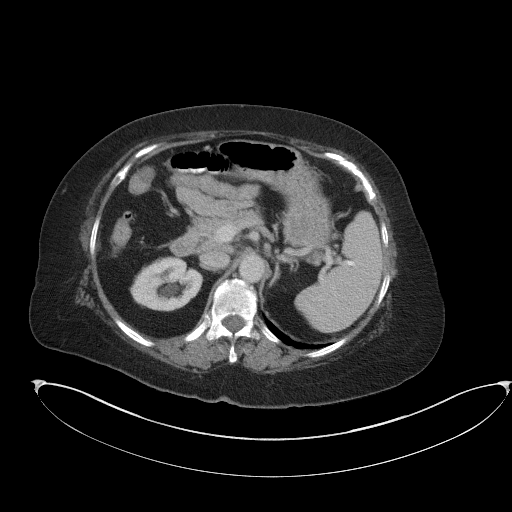
[im 77/99  soft-tissue]
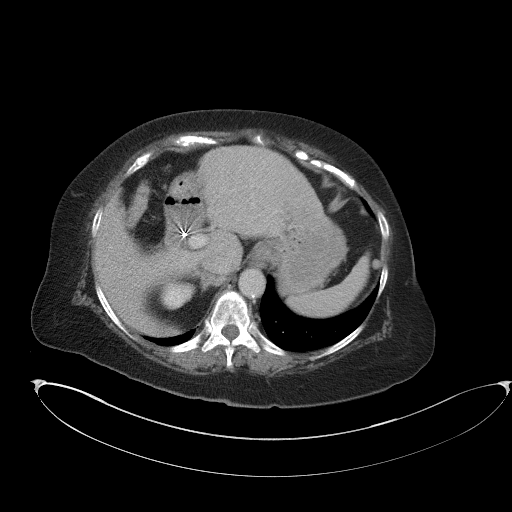
[im 88/99  soft-tissue]
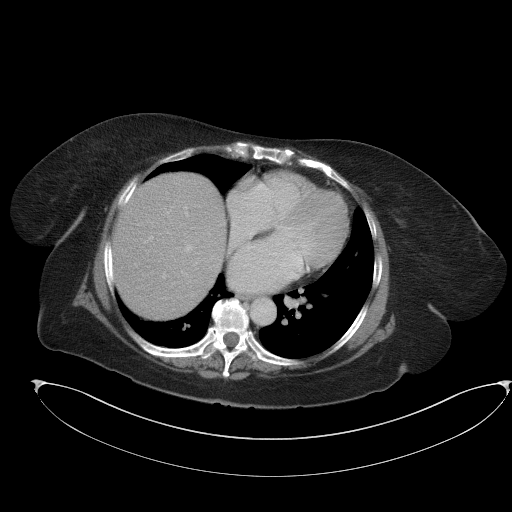
[im 93/99  soft-tissue]
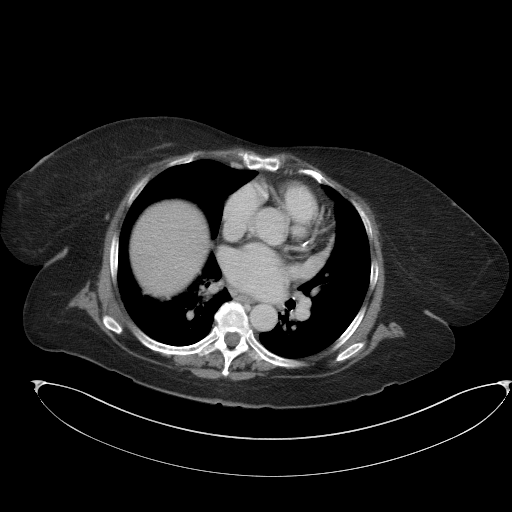

[Series 6: abd/pelvis 3.0 coronal · coronal · 0.95mm/px · 3 of 91 slices shown]
[im 31/91  soft-tissue]
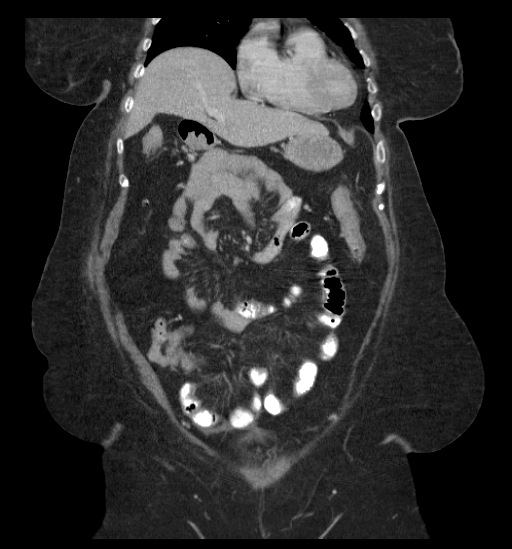
[im 41/91  soft-tissue]
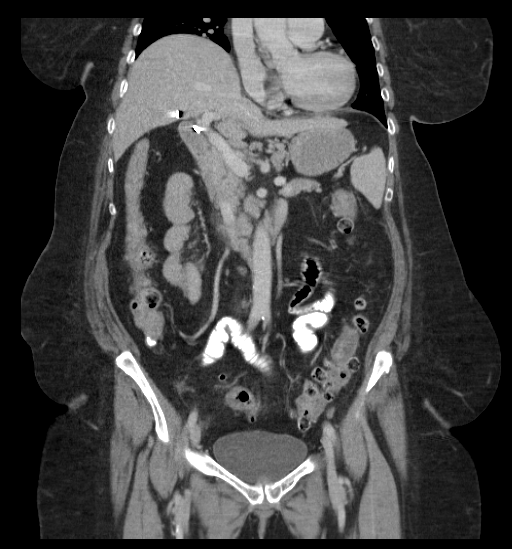
[im 51/91  soft-tissue]
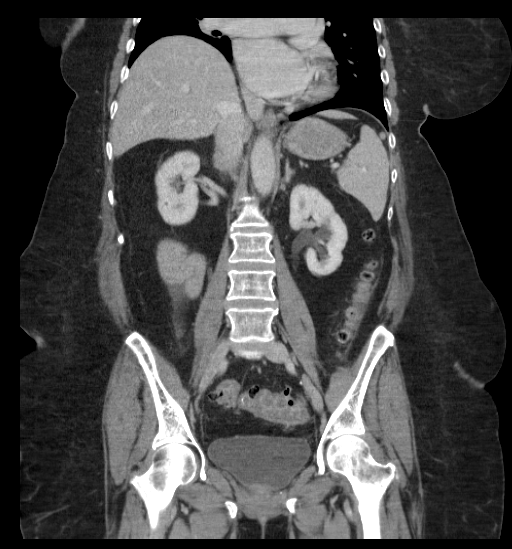

[16 of 46 positions shown; findings below may reference images not displayed]

FINDINGS: Atelectasis in the right lung base with elevation of the right
hemidiaphragm. Mild Coronary artery calcification.

Surgical absence of the gallbladder. No bile duct dilatation. The
liver, spleen, pancreas, adrenal glands, abdominal aorta, and
inferior vena cava are unremarkable. There is a mild hydronephrosis
and hydroureter on the right without significant delay in
nephrogram. Suggestion of a tiny punctate stone in the distal right
ureter. Left kidney and ureter are unremarkable. Moderately
prominent lymph nodes in the celiac axis and retroperitoneum.
Largest measuring up to about 10 mm, representing upper limits of
normal size. These are likely to be reactive. Appearance is similar
prior study. The stomach, small bowel, and colon are not abnormally
distended. Diverticula seen throughout the colon. No free air or
free fluid in the abdomen. Scarring in the anterior abdominal wall,
probably postoperative and similar to prior study.

Pelvis: Diverticulosis of the sigmoid colon with inflammatory
changes around the sigmoid region consistent with mild
diverticulitis. No abscess. Bladder wall is not thickened. Uterus
appears to be surgically absent. No free or loculated pelvic fluid
collections. No pelvic mass or lymphadenopathy. There is a small
amount of fluid in the right lower quadrant anterior to the ileus
psoas muscle. This is nonspecific but may be reactive. Appendix is
not specifically identified. Degenerative changes in the spine. No
destructive bone lesions.
IMPRESSION: Probable tiny punctate stone in the distal right ureter with mild
proximal obstruction. Nonspecific free fluid in the right pelvis
over the iliopsoas region. Diverticulosis of sigmoid colon with
inflammatory changes consistent with diverticulitis. No abscess.
Unchanged appearance of borderline enlarged lymph nodes in the
celiac axis and retroperitoneum, likely reactive.

## 2016-11-30 ENCOUNTER — Encounter (HOSPITAL_BASED_OUTPATIENT_CLINIC_OR_DEPARTMENT_OTHER): Payer: Self-pay | Admitting: *Deleted

## 2016-11-30 ENCOUNTER — Emergency Department (HOSPITAL_BASED_OUTPATIENT_CLINIC_OR_DEPARTMENT_OTHER): Payer: Medicare Other

## 2016-11-30 ENCOUNTER — Inpatient Hospital Stay (HOSPITAL_BASED_OUTPATIENT_CLINIC_OR_DEPARTMENT_OTHER)
Admission: EM | Admit: 2016-11-30 | Discharge: 2016-12-03 | DRG: 197 | Disposition: A | Discharge status: Home / Self Care | Payer: Medicare Other | Attending: Internal Medicine | Admitting: Internal Medicine

## 2016-11-30 DIAGNOSIS — Z882 Allergy status to sulfonamides status: Secondary | ICD-10-CM | POA: Diagnosis not present

## 2016-11-30 DIAGNOSIS — Z79899 Other long term (current) drug therapy: Secondary | ICD-10-CM | POA: Diagnosis not present

## 2016-11-30 DIAGNOSIS — I10 Essential (primary) hypertension: Secondary | ICD-10-CM | POA: Diagnosis present

## 2016-11-30 DIAGNOSIS — Z880 Allergy status to penicillin: Secondary | ICD-10-CM | POA: Diagnosis not present

## 2016-11-30 DIAGNOSIS — Z91041 Radiographic dye allergy status: Secondary | ICD-10-CM | POA: Diagnosis not present

## 2016-11-30 DIAGNOSIS — Z87891 Personal history of nicotine dependence: Secondary | ICD-10-CM | POA: Diagnosis not present

## 2016-11-30 DIAGNOSIS — H9193 Unspecified hearing loss, bilateral: Secondary | ICD-10-CM | POA: Diagnosis present

## 2016-11-30 DIAGNOSIS — D860 Sarcoidosis of lung: Principal | ICD-10-CM | POA: Diagnosis present

## 2016-11-30 DIAGNOSIS — H409 Unspecified glaucoma: Secondary | ICD-10-CM | POA: Diagnosis present

## 2016-11-30 DIAGNOSIS — D680 Von Willebrand's disease: Secondary | ICD-10-CM | POA: Diagnosis present

## 2016-11-30 DIAGNOSIS — E119 Type 2 diabetes mellitus without complications: Secondary | ICD-10-CM | POA: Diagnosis present

## 2016-11-30 DIAGNOSIS — M797 Fibromyalgia: Secondary | ICD-10-CM | POA: Diagnosis present

## 2016-11-30 DIAGNOSIS — R0902 Hypoxemia: Secondary | ICD-10-CM | POA: Diagnosis present

## 2016-11-30 DIAGNOSIS — I361 Nonrheumatic tricuspid (valve) insufficiency: Secondary | ICD-10-CM | POA: Diagnosis not present

## 2016-11-30 DIAGNOSIS — G4733 Obstructive sleep apnea (adult) (pediatric): Secondary | ICD-10-CM | POA: Diagnosis present

## 2016-11-30 DIAGNOSIS — Z888 Allergy status to other drugs, medicaments and biological substances status: Secondary | ICD-10-CM

## 2016-11-30 DIAGNOSIS — G8929 Other chronic pain: Secondary | ICD-10-CM | POA: Diagnosis present

## 2016-11-30 DIAGNOSIS — I272 Pulmonary hypertension, unspecified: Secondary | ICD-10-CM | POA: Diagnosis present

## 2016-11-30 DIAGNOSIS — R0602 Shortness of breath: Secondary | ICD-10-CM

## 2016-11-30 DIAGNOSIS — I340 Nonrheumatic mitral (valve) insufficiency: Secondary | ICD-10-CM | POA: Diagnosis not present

## 2016-11-30 HISTORY — DX: Fatty (change of) liver, not elsewhere classified: K76.0

## 2016-11-30 LAB — BASIC METABOLIC PANEL
Anion gap: 7 (ref 5–15)
BUN: 12 mg/dL (ref 6–20)
CHLORIDE: 102 mmol/L (ref 101–111)
CO2: 31 mmol/L (ref 22–32)
CREATININE: 0.79 mg/dL (ref 0.44–1.00)
Calcium: 8.8 mg/dL — ABNORMAL LOW (ref 8.9–10.3)
GFR calc Af Amer: >60 mL/min (ref >60)
GFR calc non Af Amer: >60 mL/min (ref >60)
GLUCOSE: 120 mg/dL — AB (ref 65–99)
POTASSIUM: 3.6 mmol/L (ref 3.5–5.1)
SODIUM: 140 mmol/L (ref 135–145)

## 2016-11-30 LAB — CBC WITH DIFFERENTIAL/PLATELET
Basophils Absolute: 0 10*3/uL (ref 0.0–0.1)
Basophils Relative: 0 %
EOS ABS: 0.2 10*3/uL (ref 0.0–0.7)
EOS PCT: 4 %
HCT: 42.5 % (ref 36.0–46.0)
Hemoglobin: 13.1 g/dL (ref 12.0–15.0)
LYMPHS ABS: 0.7 10*3/uL (ref 0.7–4.0)
LYMPHS PCT: 15 %
MCH: 26.6 pg (ref 26.0–34.0)
MCHC: 30.8 g/dL (ref 30.0–36.0)
MCV: 86.4 fL (ref 78.0–100.0)
MONO ABS: 0.8 10*3/uL (ref 0.1–1.0)
Monocytes Relative: 17 %
Neutro Abs: 2.9 10*3/uL (ref 1.7–7.7)
Neutrophils Relative %: 64 %
Platelets: 198 10*3/uL (ref 150–400)
RBC: 4.92 MIL/uL (ref 3.87–5.11)
RDW: 14.9 % (ref 11.5–15.5)
WBC: 4.6 10*3/uL (ref 4.0–10.5)

## 2016-11-30 LAB — TROPONIN I: Troponin I: <0.03 ng/mL (ref <0.03)

## 2016-11-30 MED ORDER — DORZOLAMIDE HCL 2 % OP SOLN
1.0000 [drp] | Freq: Two times a day (BID) | OPHTHALMIC | Status: DC
  Filled 2016-11-30: qty 10

## 2016-11-30 MED ORDER — KETOROLAC TROMETHAMINE 0.5 % OP SOLN
1.0000 [drp] | Freq: Every day | OPHTHALMIC | Status: DC
Start: 2016-11-30 — End: 2016-12-03
  Administered 2016-11-30 – 2016-12-03 (×4): 1 [drp] via OPHTHALMIC
  Filled 2016-11-30: qty 5

## 2016-11-30 MED ORDER — OXYCODONE HCL 5 MG PO TABS
10.0000 mg | ORAL_TABLET | Freq: Three times a day (TID) | ORAL | Status: DC | PRN
  Administered 2016-11-30 – 2016-12-02 (×4): 10 mg via ORAL
  Filled 2016-11-30 (×4): qty 2

## 2016-11-30 MED ORDER — POLYVINYL ALCOHOL 1.4 % OP SOLN
1.0000 [drp] | OPHTHALMIC | Status: DC | PRN
  Filled 2016-11-30: qty 15

## 2016-11-30 MED ORDER — VERAPAMIL HCL ER 180 MG PO TBCR
180.0000 mg | EXTENDED_RELEASE_TABLET | Freq: Every day | ORAL | Status: DC
  Administered 2016-11-30 – 2016-12-02 (×3): 180 mg via ORAL
  Filled 2016-11-30 (×4): qty 1

## 2016-11-30 MED ORDER — HYPROMELLOSE (GONIOSCOPIC) 2.5 % OP SOLN: 1.0000 [drp] | Freq: Three times a day (TID) | OPHTHALMIC | Status: DC | PRN

## 2016-11-30 MED ORDER — PANTOPRAZOLE SODIUM 40 MG PO TBEC
40.0000 mg | DELAYED_RELEASE_TABLET | Freq: Every day | ORAL | Status: DC
  Administered 2016-11-30 – 2016-12-03 (×4): 40 mg via ORAL
  Filled 2016-11-30 (×4): qty 1

## 2016-11-30 MED ORDER — LATANOPROST 0.005 % OP SOLN
1.0000 [drp] | Freq: Every day | OPHTHALMIC | Status: DC
  Administered 2016-11-30 – 2016-12-02 (×3): 1 [drp] via OPHTHALMIC
  Filled 2016-11-30: qty 2.5

## 2016-11-30 MED ORDER — ENOXAPARIN SODIUM 40 MG/0.4ML ~~LOC~~ SOLN: 40.0000 mg | SUBCUTANEOUS | Status: DC

## 2016-11-30 MED ORDER — METHYLPREDNISOLONE SODIUM SUCC 40 MG IJ SOLR
40.0000 mg | Freq: Four times a day (QID) | INTRAMUSCULAR | Status: DC
  Administered 2016-11-30 – 2016-12-03 (×11): 40 mg via INTRAVENOUS
  Filled 2016-11-30 (×11): qty 1

## 2016-11-30 MED ORDER — ESCITALOPRAM OXALATE 10 MG PO TABS
10.0000 mg | ORAL_TABLET | Freq: Every day | ORAL | Status: DC
  Administered 2016-11-30 – 2016-12-03 (×4): 10 mg via ORAL
  Filled 2016-11-30 (×4): qty 1

## 2016-11-30 MED ORDER — METHYLPREDNISOLONE SODIUM SUCC 125 MG IJ SOLR
125.0000 mg | Freq: Once | INTRAMUSCULAR | Status: AC
  Administered 2016-11-30: 125 mg via INTRAVENOUS
  Filled 2016-11-30: qty 2

## 2016-11-30 MED ORDER — ACETAMINOPHEN 500 MG PO TABS: 500.0000 mg | ORAL_TABLET | Freq: Four times a day (QID) | ORAL | Status: DC | PRN

## 2016-11-30 MED ORDER — ZOLPIDEM TARTRATE 5 MG PO TABS
2.5000 mg | ORAL_TABLET | Freq: Every evening | ORAL | Status: DC | PRN
  Administered 2016-12-01: 5 mg via ORAL
  Filled 2016-11-30: qty 1

## 2016-11-30 MED ORDER — RISAQUAD PO CAPS
1.0000 | ORAL_CAPSULE | Freq: Every day | ORAL | Status: DC
  Administered 2016-11-30 – 2016-12-03 (×4): 1 via ORAL
  Filled 2016-11-30 (×5): qty 1

## 2016-11-30 NOTE — ED Notes (Signed)
Pt on cardiac monitor and auto VS 

## 2016-11-30 NOTE — ED Notes (Signed)
Attempted IV x1 to RFA, tol well, blood obtained for labs °

## 2016-11-30 NOTE — ED Provider Notes (Signed)
[facility] Note   CSN: [id number] Arrival date & time: 11/30/16  0929     History   Chief Complaint Chief Complaint  Patient presents with  . Shortness of Breath    HPI Deanna Allison is a 66 y.o. female.  The history is provided by the patient. No language interpreter was used.  Shortness of Breath    Deanna Allison is a 66 y.o. female who presents to the Emergency Department complaining of sob.  She has a history of pulmonary sarcoidisis.  She recently completed a steroid taper after being on steroids for several months. Her taper ended a few weeks ago. Since yesterday she is endorsed increased shortness of breath with dyspnea on exertion and cough productive of small amount of phlegm. She states that these symptoms have been worse since the change in the weather. No fever, chest pain, vomiting, diarrhea, leg swelling. She does have CPAP at home that has oxygen that runs through the night but she is not on supplemental oxygen during the day. She is checked her oxygen levels at home today was in the 70s. Symptoms are severe, constant, worsening. Past Medical History:  Diagnosis Date  . Cataracts, bilateral   . Diabetes mellitus without complication (HCC)   . Diverticulitis   . Fatty liver   . Fibromyalgia   . Glaucoma   . Hearing loss of both ears   . Heart valve disorder    trace AR, mild PR, mild to mod MR/TR, EF 60% 09/22/12 echo ([facility])  . Hypertension    See [facility]  . Kidney stone   . OSA (obstructive sleep apnea)    uses CPAP sleep study > 3 years ago  . PONV (postoperative nausea and vomiting)   . Pulmonary sarcoidosis (HCC)   . Von Willebrand disease [facility])     Patient Active Problem List   Diagnosis Date Noted  . Hypoxia 11/30/2016  . Diverticulitis of large intestine without perforation or abscess without bleeding   . Fibromyalgia   . C. difficile colitis   . Acute diverticulitis 06/15/2014   . Nausea vomiting and diarrhea 06/15/2014  . Essential hypertension 06/15/2014  . Sleep apnea 06/15/2014  . Sigmoid diverticulitis   . Right kidney stone   . Vomiting 06/14/2014  . Loose stools 06/14/2014  . Diverticulitis 06/14/2014    Past Surgical History:  Procedure Laterality Date  . ABDOMINAL HYSTERECTOMY    . APPENDECTOMY    . BACK SURGERY    . CHOLECYSTECTOMY    . HAND SURGERY    . HERNIA REPAIR    . LUNG BIOPSY    . MULTIPLE EXTRACTIONS WITH ALVEOLOPLASTY N/A 03/18/2013   Procedure: MULTIPLE SURGICAL TEETH EXTRACTIONS;  Surgeon: Hinton Dyer, DDS;  Location: [facility] OR;  Service: Oral Surgery;  Laterality: N/A;  . TIBIA FRACTURE SURGERY      OB History    No data available       Home Medications    Prior to Admission medications   Medication Sig Start Date End Date Taking? Authorizing Provider  acetaminophen (TYLENOL) 500 MG tablet Take 500-1,000 mg by mouth every 6 (six) hours as needed for mild pain.   Yes [provider]  dorzolamide (TRUSOPT) 2 % ophthalmic solution Place 1 drop into both eyes 2 (two) times daily.   Yes [provider]  escitalopram (LEXAPRO) 10 MG tablet Take 10 mg by mouth daily. 02/03/16  Yes [provider]  hydroxypropyl methylcellulose /  hypromellose (ISOPTO TEARS / GONIOVISC) 2.5 % ophthalmic solution Place 1-2 drops into both eyes 3 (three) times daily as needed for dry eyes.   Yes [provider]  ketorolac (ACULAR) 0.4 % SOLN Place 1 drop into both eyes daily.  08/24/16  Yes [provider]  latanoprost (XALATAN) 0.005 % ophthalmic solution INT 1 GTT INTO OU NIGHTLY 08/28/16  Yes [provider]  omeprazole (PRILOSEC) 40 MG capsule Take 40 mg by mouth daily.   Yes [provider]  Oxycodone HCl 10 MG TABS TK 1 T PO Q 8 H PRN PAIN 11/02/16  Yes [provider]  Probiotic Product (PROBIOTIC DAILY PO) Take 1 tablet by mouth daily.   Yes [provider]    verapamil (CALAN-SR) 180 MG CR tablet Take 180 mg by mouth daily.   Yes [provider]  zolpidem (AMBIEN) 5 MG tablet Take 2.5-5 mg by mouth at bedtime as needed for sleep.    Yes [provider]  ciprofloxacin (CIPRO) 500 MG tablet Take 1 tablet (500 mg total) by mouth 2 (two) times daily. Take for 9 days then stop. Patient not taking: Reported on 11/30/2016 08/02/14   Rodolph Bong, MD  docusate sodium (COLACE) 100 MG capsule Take 1 capsule (100 mg total) by mouth 2 (two) times daily. Patient not taking: Reported on 11/30/2016 08/02/14   Rodolph Bong, MD  metroNIDAZOLE (FLAGYL) 500 MG tablet Take 1 tablet (500 mg total) by mouth every 8 (eight) hours. Take for 9 days then stop. Patient not taking: Reported on 11/30/2016 08/02/14   Rodolph Bong, MD  ondansetron (ZOFRAN) 4 MG tablet Take 1 tablet (4 mg total) by mouth every 8 (eight) hours as needed for nausea or vomiting. Patient not taking: Reported on 11/30/2016 09/12/15   Lavera Guise, MD  Oxycodone HCl 20 MG TABS Take 1 tablet (20 mg total) by mouth every 6 (six) hours as needed (pain). Patient not taking: Reported on 11/30/2016 08/02/14   Rodolph Bong, MD    Family History No family history on file.  Social History Social History  Substance Use Topics  . Smoking status: Former Smoker    Packs/day: 0.50    Years: 16.00    Quit date: 04/08/1976  . Smokeless tobacco: Never Used  . Alcohol use No     Allergies   Benadryl [diphenhydramine hcl (sleep)]; Cymbalta [duloxetine hcl]; Gabapentin; Iodine; Ivp dye [iodinated diagnostic agents]; Lyrica [pregabalin]; Nsaids; Penicillins; Sulfamethoxazole; and Nickel   Review of Systems Review of Systems  Respiratory: Positive for shortness of breath.   All other systems reviewed and are negative.    Physical Exam Updated Vital Signs BP (!) 151/82 (BP Location: Right Arm)   Pulse 96   Temp 98.7 F (37.1 C) (Oral)   Resp 20   Ht 5\' 4"  (1.626 m)    Wt 93.5 kg (206 lb 2.1 oz)   SpO2 96%   BMI 35.38 kg/m   Physical Exam  Constitutional: She is oriented to person, place, and time. She appears well-developed and well-nourished.  HENT:  Head: Normocephalic and atraumatic.  Cardiovascular: Normal rate and regular rhythm.   No murmur heard. Pulmonary/Chest:  Tachypnea. Occasional crackles in the right lung base.  Abdominal: Soft. There is no tenderness. There is no rebound and no guarding.  Musculoskeletal: She exhibits no edema or tenderness.  Neurological: She is alert and oriented to person, place, and time.  Skin: Skin is warm and dry.  Psychiatric:  She has a normal mood and affect. Her behavior is normal.  Nursing note and vitals reviewed.    ED Treatments / Results  Labs (all labs ordered are listed, but only abnormal results are displayed) Labs Reviewed  BASIC METABOLIC PANEL - Abnormal; Notable for the following:       Result Value   Glucose, Bld 120 (*)    Calcium 8.8 (*)    All other components within normal limits  CBC WITH DIFFERENTIAL/PLATELET  TROPONIN I    EKG  EKG Interpretation  Date/Time:  Saturday November 30 2016 09:21:10 EDT Ventricular Rate:  92 PR Interval:    QRS Duration: 94 QT Interval:  349 QTC Calculation: 432 R Axis:   50 Text Interpretation:  Sinus rhythm Atrial premature complexes Sinus pause Probable left atrial enlargement Nonspecific T abnrm, anterolateral leads Confirmed by Tilden Fossa [phone]) on 11/30/2016 10:17:46 AM       Radiology Dg Chest 2 View  Result Date: 11/30/2016 CLINICAL DATA:  Cough and shortness breath for several days. Hypoxia. Sarcoidosis. EXAM: CHEST  2 VIEW COMPARISON:  06/14/2014 FINDINGS: The heart size and mediastinal contours are within normal limits. Chronic elevation of right hemidiaphragm again seen with scarring in the right mid and lower lung. No evidence of pulmonary infiltrate or edema. No evidence of pneumothorax or pleural effusion. IMPRESSION:  Stable elevation of right hemidiaphragm and right lower lung atelectasis versus scarring. No acute findings. Electronically Signed   By: Myles Rosenthal M.D.   On: 11/30/2016 10:31    Procedures Procedures (including critical care time)  Medications Ordered in ED Medications  enoxaparin (LOVENOX) injection 40 mg (not administered)  acetaminophen (TYLENOL) tablet 500-1,000 mg (not administered)  dorzolamide (TRUSOPT) 2 % ophthalmic solution 1 drop (not administered)  escitalopram (LEXAPRO) tablet 10 mg (not administered)  ketorolac (ACULAR) 0.5 % ophthalmic solution 1 drop (not administered)  latanoprost (XALATAN) 0.005 % ophthalmic solution 1 drop (not administered)  oxyCODONE (Oxy IR/ROXICODONE) immediate release tablet 10 mg (10 mg Oral Given 11/30/16 1609)  zolpidem (AMBIEN) tablet 2.5-5 mg (not administered)  verapamil (CALAN-SR) CR tablet 180 mg (not administered)  polyvinyl alcohol (LIQUIFILM TEARS) 1.4 % ophthalmic solution 1 drop (not administered)  acidophilus (RISAQUAD) capsule 1 capsule (not administered)  methylPREDNISolone sodium succinate (SOLU-MEDROL) 40 mg/mL injection 40 mg (40 mg Intravenous Given 11/30/16 1610)  pantoprazole (PROTONIX) EC tablet 40 mg (not administered)  methylPREDNISolone sodium succinate (SOLU-MEDROL) 125 mg/2 mL injection 125 mg (125 mg Intravenous Given 11/30/16 1035)     Initial Impression / Assessment and Plan / ED Course  I have reviewed the triage vital signs and the nursing notes.  Pertinent labs & imaging results that were available during my care of the patient were reviewed by me and considered in my medical decision making (see chart for details).     Patient with history of sarcoidosis here with increased shortness of breath, recently completed a steroid taper a few weeks ago. She did have hypoxia on ED arrival that improved after supplemental oxygenation. Her symptoms are significantly improved with oxygen administration. She was treated  with steroids for her sarcoidosis. On attempt to remove oxygen she does have recurrent shortness of breath, chest tightness as well as hypoxia. Presentation is not consistent with PE, CHF, pneumonia. Plan to admit for ongoing treatment of hypoxia with sarcoidosis.  Hospitalist consulted for admission.  Pt updated of findings of studies and she is in agreement with plan.    Final Clinical Impressions(s) / ED Diagnoses  Final diagnoses:  Hypoxia  Shortness of breath    New Prescriptions Current Discharge Medication List       Tilden Fossa, MD 11/30/16 [phone]

## 2016-11-30 NOTE — ED Triage Notes (Signed)
Pt reports productive cough x several days. Denies fever, n/v/d. Hx of sarcoidosis (reports checking sats at home with numbers as low as 70s -- reports she used to wear O2 but not anymore). Sats high-70s to mid-80s on RA during triage. Pt placed on 4L [place] with sats increasing to mid to high-90s. Pt reports recent tx with steroids.

## 2016-11-30 NOTE — ED Notes (Signed)
Pt given crackers, cheese and ginger ale.   Pt remains on cardiac monitor and auto VS

## 2016-11-30 NOTE — H&P (Signed)
History and Physical    Troyce Febo Allison ZOX:[id number] DOB: 23-Apr-1950 DOA: 11/30/2016  PCP: Angelica Chessman, MD Patient coming from: home  Chief Complaint: short of breath  HPI: Deanna Allison is a 66 y.o. female with medical history significant of sarcoidosis followed by [facility] in [facility] admitted with increasing shortness of breath. According to the patient she gets one flare of sarcoidosis per year. But this year this is her second flare. She was recently on by mouth prednisone and the dose was tapered to 5 mg and then stopped a week ago. She started having increasing shortness of breath yesterday and got worse today and so she came to the ER. Her saturation was 70% on room air. She was placed on 2 L of oxygen. She does not have oxygen at home. Patient's chest x-ray did not's reveal anything acute. Chest x-ray did not show any evidence of pneumonia or pleural effusion. She denies any fever chills chest pain cough headaches changes with her vision abdominal pain and urinary complaints at this time. She does have complaints and history of fibromyalgia and she is on narcotics for that on a long-term basis. She is requesting her pain medications at this time. She appears to be in no acute distress. She is talking in full sentences. She was able to give me the full history. Her husband is by the bedside. Patient reports that her pulmonologist is sick and may not come back and is requesting to see a pulmonologist while she is in the hospital to manage her sarcoidosis as an outpatient.    ED Course: Received Solu-Medrol 125 mg 1 dose in the ER.  Review of Systems: As per HPI otherwise all other systems reviewed and are negative  Ambulatory Statusambulatory  Past Medical History:  Diagnosis Date  . Cataracts, bilateral   . Diabetes mellitus without complication (HCC)   . Diverticulitis   . Fatty liver   . Fibromyalgia   . Glaucoma   . Hearing loss of both ears    . Heart valve disorder    trace AR, mild PR, mild to mod MR/TR, EF 60% 09/22/12 echo ([facility])  . Hypertension    See [facility] at [facility]  . Kidney stone   . OSA (obstructive sleep apnea)    uses CPAP sleep study > 3 years ago  . PONV (postoperative nausea and vomiting)   . Pulmonary sarcoidosis (HCC)   . Von Willebrand disease (HCC)     Past Surgical History:  Procedure Laterality Date  . ABDOMINAL HYSTERECTOMY    . APPENDECTOMY    . BACK SURGERY    . CHOLECYSTECTOMY    . HAND SURGERY    . HERNIA REPAIR    . LUNG BIOPSY    . MULTIPLE EXTRACTIONS WITH ALVEOLOPLASTY N/A 03/18/2013   Procedure: MULTIPLE SURGICAL TEETH EXTRACTIONS;  Surgeon: Hinton Dyer, DDS;  Location: [facility] OR;  Service: Oral Surgery;  Laterality: N/A;  . TIBIA FRACTURE SURGERY      Social History   Social History  . Marital status: Married    Spouse name: N/A  . Number of children: N/A  . Years of education: N/A   Occupational History  . Not on file.   Social History Main Topics  . Smoking status: Former Smoker    Packs/day: 0.50    Years: 16.00    Quit date: 04/08/1976  . Smokeless tobacco: Never Used  . Alcohol use No  . Drug use: No  .  Sexual activity: Not on file   Other Topics Concern  . Not on file   Social History Narrative  . No narrative on file    Allergies  Allergen Reactions  . Benadryl [Diphenhydramine Hcl (Sleep)]     Hallucinations   . Cymbalta [Duloxetine Hcl] Nausea And Vomiting  . Gabapentin Other (See Comments)    Unknown  . Iodine Nausea And Vomiting  . Ivp Dye [Iodinated Diagnostic Agents]   . Lyrica [Pregabalin]     Depression  . Nsaids     Sits in my stomach  . Penicillins Hives    Has patient had a PCN reaction causing immediate rash, facial/tongue/throat swelling, SOB or lightheadedness with hypotension: No Has patient had a PCN reaction causing severe rash involving mucus membranes or skin necrosis: No Has patient had a PCN reaction that  required hospitalization: No Has patient had a PCN reaction occurring within the last 10 years: No If all of the above answers are "NO", then may proceed with Cephalosporin use.   . Sulfamethoxazole Hives  . Nickel Rash    No family history on file.    Prior to Admission medications   Medication Sig Start Date End Date Taking? Authorizing Provider  acetaminophen (TYLENOL) 500 MG tablet Take 500-1,000 mg by mouth every 6 (six) hours as needed for mild pain.   Yes [provider]  dorzolamide (TRUSOPT) 2 % ophthalmic solution Place 1 drop into both eyes 2 (two) times daily.   Yes [provider]  escitalopram (LEXAPRO) 10 MG tablet Take 10 mg by mouth daily. 02/03/16  Yes [provider]  hydroxypropyl methylcellulose / hypromellose (ISOPTO TEARS / GONIOVISC) 2.5 % ophthalmic solution Place 1-2 drops into both eyes 3 (three) times daily as needed for dry eyes.   Yes [provider]  ketorolac (ACULAR) 0.4 % SOLN Place 1 drop into both eyes daily.  08/24/16  Yes [provider]  latanoprost (XALATAN) 0.005 % ophthalmic solution INT 1 GTT INTO OU NIGHTLY 08/28/16  Yes [provider]  omeprazole (PRILOSEC) 40 MG capsule Take 40 mg by mouth daily.   Yes [provider]  Oxycodone HCl 10 MG TABS TK 1 T PO Q 8 H PRN PAIN 11/02/16  Yes [provider]  Probiotic Product (PROBIOTIC DAILY PO) Take 1 tablet by mouth daily.   Yes [provider]  verapamil (CALAN-SR) 180 MG CR tablet Take 180 mg by mouth daily.   Yes [provider]  zolpidem (AMBIEN) 5 MG tablet Take 2.5-5 mg by mouth at bedtime as needed for sleep.    Yes [provider]  ciprofloxacin (CIPRO) 500 MG tablet Take 1 tablet (500 mg total) by mouth 2 (two) times daily. Take for 9 days then stop. Patient not taking: Reported on 11/30/2016 08/02/14   Rodolph Bong, MD  docusate sodium (COLACE) 100 MG capsule Take 1 capsule (100 mg total) by  mouth 2 (two) times daily. Patient not taking: Reported on 11/30/2016 08/02/14   Rodolph Bong, MD  metroNIDAZOLE (FLAGYL) 500 MG tablet Take 1 tablet (500 mg total) by mouth every 8 (eight) hours. Take for 9 days then stop. Patient not taking: Reported on 11/30/2016 08/02/14   Rodolph Bong, MD  ondansetron (ZOFRAN) 4 MG tablet Take 1 tablet (4 mg total) by mouth every 8 (eight) hours as needed for nausea or vomiting. Patient not taking: Reported on 11/30/2016 09/12/15   Lavera Guise, MD  Oxycodone HCl 20 MG TABS  Take 1 tablet (20 mg total) by mouth every 6 (six) hours as needed (pain). Patient not taking: Reported on 11/30/2016 08/02/14   Rodolph Bong, MD    Physical Exam: Vitals:   11/30/16 1200 11/30/16 1252 11/30/16 1300 11/30/16 1351  BP: 136/72 (!) 145/80 (!) 147/89 (!) 151/82  Pulse: 89 90 88 96  Resp: (!) 28 (!) 22 (!) 21 20  Temp:  98.6 F (37 C)  98.7 F (37.1 C)  TempSrc:  Oral  Oral  SpO2: 96% 95% 95% 96%  Weight:    93.5 kg (206 lb 2.1 oz)  Height:    5\' 4"  (1.626 m)     General:  Appears calm and comfortable Eyes:  PERRL, EOMI, normal lids, iris ENT:  grossly normal hearing, lips & tongue, mmm Neck:  no LAD, masses or thyromegaly Cardiovascular:  RRR, no m/r/g. No LE edema.  Respiratory: coarse breath sounds BL., no w/r/r. Normal respiratory effort. Abdomen: soft, ntnd, NABS Skin: no rash or induration seen on limited exam Musculoskeletal: grossly normal tone BUE/BLE, good ROM, no bony abnormality Psychiatric: grossly normal mood and affect, speech fluent and appropriate, AOx3 Neurologic:  CN 2-12 grossly intact, moves all extremities in coordinated fashion, sensation intact  Labs on Admission: I have personally reviewed following labs and imaging studies  CBC:  Recent Labs Lab 11/30/16 0930  WBC 4.6  NEUTROABS 2.9  HGB 13.1  HCT 42.5  MCV 86.4  PLT 198   Basic Metabolic Panel:  Recent Labs Lab 11/30/16 0930  NA 140  K 3.6  CL 102    CO2 31  GLUCOSE 120*  BUN 12  CREATININE 0.79  CALCIUM 8.8*   GFR: Estimated Creatinine Clearance: 75.6 mL/min (by C-G formula based on SCr of 0.79 mg/dL). Liver Function Tests: No results for input(s): AST, ALT, ALKPHOS, BILITOT, PROT, ALBUMIN in the last 168 hours. No results for input(s): LIPASE, AMYLASE in the last 168 hours. No results for input(s): AMMONIA in the last 168 hours. Coagulation Profile: No results for input(s): INR, PROTIME in the last 168 hours. Cardiac Enzymes:  Recent Labs Lab 11/30/16 0930  TROPONINI <0.03   BNP (last 3 results) No results for input(s): PROBNP in the last 8760 hours. HbA1C: No results for input(s): HGBA1C in the last 72 hours. CBG: No results for input(s): GLUCAP in the last 168 hours. Lipid Profile: No results for input(s): CHOL, HDL, LDLCALC, TRIG, CHOLHDL, LDLDIRECT in the last 72 hours. Thyroid Function Tests: No results for input(s): TSH, T4TOTAL, FREET4, T3FREE, THYROIDAB in the last 72 hours. Anemia Panel: No results for input(s): VITAMINB12, FOLATE, FERRITIN, TIBC, IRON, RETICCTPCT in the last 72 hours. Urine analysis:    Component Value Date/Time   COLORURINE YELLOW 09/12/2015 1236   APPEARANCEUR CLEAR 09/12/2015 1236   LABSPEC 1.018 09/12/2015 1236   PHURINE 6.0 09/12/2015 1236   GLUCOSEU NEGATIVE 09/12/2015 1236   HGBUR NEGATIVE 09/12/2015 1236   BILIRUBINUR NEGATIVE 09/12/2015 1236   KETONESUR 15 (A) 09/12/2015 1236   PROTEINUR NEGATIVE 09/12/2015 1236   UROBILINOGEN 1.0 07/31/2014 1146   NITRITE NEGATIVE 09/12/2015 1236   LEUKOCYTESUR SMALL (A) 09/12/2015 1236    Creatinine Clearance: Estimated Creatinine Clearance: 75.6 mL/min (by C-G formula based on SCr of 0.79 mg/dL).  Sepsis Labs: @LABRCNTIP (procalcitonin:4,lacticidven:4) )No results found for this or any previous visit (from the past 240 hour(s)).   Radiological Exams on Admission: Dg Chest 2 View  Result Date: 11/30/2016 CLINICAL DATA:  Cough and  shortness breath for several  days. Hypoxia. Sarcoidosis. EXAM: CHEST  2 VIEW COMPARISON:  06/14/2014 FINDINGS: The heart size and mediastinal contours are within normal limits. Chronic elevation of right hemidiaphragm again seen with scarring in the right mid and lower lung. No evidence of pulmonary infiltrate or edema. No evidence of pneumothorax or pleural effusion. IMPRESSION: Stable elevation of right hemidiaphragm and right lower lung atelectasis versus scarring. No acute findings. Electronically Signed   By: Myles Rosenthal M.D.   On: 11/30/2016 10:31    EKG: Independently reviewed.   Assessment/Plan Active Problems:   Hypoxia   Hypoxia secondary to sarcoidosis flare. Patient will be treated with IV Solu-Medrol. I will hold off on  starting her on antibiotics at this time. Her chest x-ray does not reveal any evidence of infection. Her WBC count is normal. Her white count is 4.6. She does not have any signs of sepsis or infection at this time. Patient would like to see a pulmonologist during her hospital stay. Please consult the pulmonologist on call on Monday. I will get the ace level on her. Her last is level was 6 months ago which she does not remember. Her calcium level is 8.8.  Fibromyalgia restart oxycodone.  OSA continue CPAP which she uses at home.       DVT prophylaxis: lovenox Code Status: full Family Communication:husband  Disposition Plan: home Consults called: none Admission status: inpatient   Alwyn Ren MD [facility]  If 7PM-7AM, please contact night-coverage [email or web address] Password [email or web address]  11/30/2016, 3:35 PM

## 2016-11-30 NOTE — ED Notes (Signed)
Patient transported to X-ray 

## 2016-11-30 NOTE — Progress Notes (Signed)
66 yo female with history of sarcoidosis transferring from [facility] by dr Pecola Leisure with a diagnosis of sarcoid exacerbation.patient presented with shortness of breath and hypoxia saturating 70 % on RA.patient followed by [facility].she recently had a flare of sarcoid was being treated with steroids as an outpatient.she finished oral steroids few days ago.her saturation is 91%on 2L.her chest xray does not show any acute process.she was given 125 mg solumedrol in er.she is tachypneic,afebrile,not tachy.she will be admitted to tele.

## 2016-11-30 NOTE — ED Notes (Signed)
ED Provider at bedside. 

## 2016-12-01 DIAGNOSIS — M797 Fibromyalgia: Secondary | ICD-10-CM

## 2016-12-01 DIAGNOSIS — D860 Sarcoidosis of lung: Secondary | ICD-10-CM | POA: Diagnosis present

## 2016-12-01 DIAGNOSIS — I10 Essential (primary) hypertension: Secondary | ICD-10-CM

## 2016-12-01 LAB — COMPREHENSIVE METABOLIC PANEL
ALBUMIN: 3.5 g/dL (ref 3.5–5.0)
ALK PHOS: 68 U/L (ref 38–126)
ALT: 10 U/L — AB (ref 14–54)
AST: 18 U/L (ref 15–41)
Anion gap: 6 (ref 5–15)
BUN: 13 mg/dL (ref 6–20)
CALCIUM: 9.2 mg/dL (ref 8.9–10.3)
CO2: 32 mmol/L (ref 22–32)
CREATININE: 0.7 mg/dL (ref 0.44–1.00)
Chloride: 102 mmol/L (ref 101–111)
GFR calc Af Amer: >60 mL/min (ref >60)
GFR calc non Af Amer: >60 mL/min (ref >60)
GLUCOSE: 226 mg/dL — AB (ref 65–99)
Potassium: 3.9 mmol/L (ref 3.5–5.1)
SODIUM: 140 mmol/L (ref 135–145)
Total Bilirubin: 0.4 mg/dL (ref 0.3–1.2)
Total Protein: 7.1 g/dL (ref 6.5–8.1)

## 2016-12-01 LAB — CBC
HCT: 42.5 % (ref 36.0–46.0)
Hemoglobin: 13.3 g/dL (ref 12.0–15.0)
MCH: 26.7 pg (ref 26.0–34.0)
MCHC: 31.3 g/dL (ref 30.0–36.0)
MCV: 85.3 fL (ref 78.0–100.0)
PLATELETS: 225 10*3/uL (ref 150–400)
RBC: 4.98 MIL/uL (ref 3.87–5.11)
RDW: 14.2 % (ref 11.5–15.5)
WBC: 5.6 10*3/uL (ref 4.0–10.5)

## 2016-12-01 LAB — SEDIMENTATION RATE: Sed Rate: 17 mm/hr (ref 0–22)

## 2016-12-01 MED ORDER — FUROSEMIDE 10 MG/ML IJ SOLN
20.0000 mg | Freq: Once | INTRAMUSCULAR | Status: AC
  Administered 2016-12-01: 20 mg via INTRAVENOUS
  Filled 2016-12-01: qty 2

## 2016-12-01 NOTE — Progress Notes (Signed)
PROGRESS NOTE    Deanna Allison  YCX:[id number] DOB: 1950-10-09 DOA: 11/30/2016 PCP: Angelica Chessman, MD  Brief Narrative:Deanna Allison is a 66 y.o. female with medical history significant of sarcoidosis followed by [facility] in [facility] admitted with increasing shortness of breath. According to the patient she gets one flare of sarcoidosis per year. But this year this is her second flare. She was recently on by mouth prednisone and the dose was tapered to 5 mg and then stopped a week ago. She started having increasing shortness of breath yesterday and got worse today and so she came to the ER. Her saturation was 70% on room air, chest x-ray did not reveal anything acute.   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 -CXR clear -continue nebs and IV solumedrol today -tapered off prednisone 1 week ago -also give lasix x1 single dose, last CT chest 5/18 at [facility] with no adenopathy and concern for pulm HTN -check ECHO to eval PA pressures    Essential hypertension -stable, continue verapamil    Fibromyalgia/chronic pain -continue oxycodone per home regimen   Von Willebrand disease -DC lovenox  DVT prophylaxis: SCDs due to vW disease Code Status: FUll COde Family Communication: None at bedside Disposition Plan: Home tomorrow if improved   Subjective: Breathing improving  Objective: Vitals:   11/30/16 1351 11/30/16 2336 12/01/16 0533 12/01/16 0947  BP: (!) 151/82 (!) 154/91 (!) 145/81 (!) 124/51  Pulse: 96 70 67   Resp: 20 20 20    Temp: 98.7 F (37.1 C) 98.1 F (36.7 C) 98 F (36.7 C)   TempSrc: Oral Oral Oral   SpO2: 96% 98% 97%   Weight: 93.5 kg (206 lb 2.1 oz)     Height: 5\' 4"  (1.626 m)       Intake/Output Summary (Last 24 hours) at 12/01/16 1237 Last data filed at 12/01/16 0500  Gross per 24 hour  Intake              960 ml  Output              600 ml  Net              360 ml    Filed Weights   11/30/16 0925 11/30/16 1351  Weight: 93.9 kg (207 lb) 93.5 kg (206 lb 2.1 oz)    Examination:  General exam: Appears calm and comfortable  Respiratory system: few crackles at bases Cardiovascular system: S1 & S2 heard, RRR. No JVD, murmurs Gastrointestinal system: Abdomen is nondistended, soft and nontender. Normal bowel sounds heard. Central nervous system: Alert and oriented. No focal neurological deficits. Extremities: Symmetric 5 x 5 power. Skin: No rashes, lesions or ulcers Psychiatry: Judgement and insight appear normal. Mood & affect appropriate.     Data Reviewed:   CBC:  Recent Labs Lab 11/30/16 0930 12/01/16 0514  WBC 4.6 5.6  NEUTROABS 2.9  --   HGB 13.1 13.3  HCT 42.5 42.5  MCV 86.4 85.3  PLT 198 225   Basic Metabolic Panel:  Recent Labs Lab 11/30/16 0930 12/01/16 0514  NA 140 140  K 3.6 3.9  CL 102 102  CO2 31 32  GLUCOSE 120* 226*  BUN 12 13  CREATININE 0.79 0.70  CALCIUM 8.8* 9.2   GFR: Estimated Creatinine Clearance: 75.6 mL/min (by C-G formula based on SCr of 0.7 mg/dL). Liver Function Tests:  Recent Labs Lab 12/01/16 0514  AST 18  ALT 10*  ALKPHOS 68  BILITOT 0.4  PROT 7.1  ALBUMIN 3.5   No results for input(s): LIPASE, AMYLASE in the last 168 hours. No results for input(s): AMMONIA in the last 168 hours. Coagulation Profile: No results for input(s): INR, PROTIME in the last 168 hours. Cardiac Enzymes:  Recent Labs Lab 11/30/16 0930  TROPONINI <0.03   BNP (last 3 results) No results for input(s): PROBNP in the last 8760 hours. HbA1C: No results for input(s): HGBA1C in the last 72 hours. CBG: No results for input(s): GLUCAP in the last 168 hours. Lipid Profile: No results for input(s): CHOL, HDL, LDLCALC, TRIG, CHOLHDL, LDLDIRECT in the last 72 hours. Thyroid Function Tests: No results for input(s): TSH, T4TOTAL, FREET4, T3FREE, THYROIDAB in the last 72 hours. Anemia Panel: No results for  input(s): VITAMINB12, FOLATE, FERRITIN, TIBC, IRON, RETICCTPCT in the last 72 hours. Urine analysis:    Component Value Date/Time   COLORURINE YELLOW 09/12/2015 1236   APPEARANCEUR CLEAR 09/12/2015 1236   LABSPEC 1.018 09/12/2015 1236   PHURINE 6.0 09/12/2015 1236   GLUCOSEU NEGATIVE 09/12/2015 1236   HGBUR NEGATIVE 09/12/2015 1236   BILIRUBINUR NEGATIVE 09/12/2015 1236   KETONESUR 15 (A) 09/12/2015 1236   PROTEINUR NEGATIVE 09/12/2015 1236   UROBILINOGEN 1.0 07/31/2014 1146   NITRITE NEGATIVE 09/12/2015 1236   LEUKOCYTESUR SMALL (A) 09/12/2015 1236   Sepsis Labs: @LABRCNTIP (procalcitonin:4,lacticidven:4)  )No results found for this or any previous visit (from the past 240 hour(s)).       Radiology Studies: Dg Chest 2 View  Result Date: 11/30/2016 CLINICAL DATA:  Cough and shortness breath for several days. Hypoxia. Sarcoidosis. EXAM: CHEST  2 VIEW COMPARISON:  06/14/2014 FINDINGS: The heart size and mediastinal contours are within normal limits. Chronic elevation of right hemidiaphragm again seen with scarring in the right mid and lower lung. No evidence of pulmonary infiltrate or edema. No evidence of pneumothorax or pleural effusion. IMPRESSION: Stable elevation of right hemidiaphragm and right lower lung atelectasis versus scarring. No acute findings. Electronically Signed   By: Myles Rosenthal M.D.   On: 11/30/2016 10:31        Scheduled Meds: . acidophilus  1 capsule Oral Daily  . dorzolamide  1 drop Both Eyes BID  . enoxaparin (LOVENOX) injection  40 mg Subcutaneous Q24H  . escitalopram  10 mg Oral Daily  . ketorolac  1 drop Both Eyes Daily  . latanoprost  1 drop Both Eyes QHS  . methylPREDNISolone (SOLU-MEDROL) injection  40 mg Intravenous Q6H  . pantoprazole  40 mg Oral Daily  . verapamil  180 mg Oral Daily   Continuous Infusions:   LOS: 1 day    Time spent:    Zannie Cove, MD [facility] Pager [phone]  If 7PM-7AM, please contact  night-coverage [email or web address] Password [facility] 12/01/2016, 12:37 PM

## 2016-12-01 NOTE — Progress Notes (Signed)
Patients husband brought home CPAP machine. I asked them to call Respiratory if they needed anything.

## 2016-12-02 ENCOUNTER — Inpatient Hospital Stay (HOSPITAL_COMMUNITY): Payer: Medicare Other

## 2016-12-02 DIAGNOSIS — R0902 Hypoxemia: Secondary | ICD-10-CM

## 2016-12-02 DIAGNOSIS — I361 Nonrheumatic tricuspid (valve) insufficiency: Secondary | ICD-10-CM

## 2016-12-02 DIAGNOSIS — I340 Nonrheumatic mitral (valve) insufficiency: Secondary | ICD-10-CM

## 2016-12-02 LAB — BASIC METABOLIC PANEL
Anion gap: 5 (ref 5–15)
BUN: 24 mg/dL — ABNORMAL HIGH (ref 6–20)
CHLORIDE: 101 mmol/L (ref 101–111)
CO2: 32 mmol/L (ref 22–32)
CREATININE: 0.83 mg/dL (ref 0.44–1.00)
Calcium: 9.4 mg/dL (ref 8.9–10.3)
GFR calc non Af Amer: >60 mL/min (ref >60)
GLUCOSE: 274 mg/dL — AB (ref 65–99)
Potassium: 4.4 mmol/L (ref 3.5–5.1)
Sodium: 138 mmol/L (ref 135–145)

## 2016-12-02 LAB — ECHOCARDIOGRAM COMPLETE
HEIGHTINCHES: 64 in
WEIGHTICAEL: 3298.08 [oz_av]

## 2016-12-02 LAB — CBC
HCT: 41.5 % (ref 36.0–46.0)
Hemoglobin: 12.7 g/dL (ref 12.0–15.0)
MCH: 26.6 pg (ref 26.0–34.0)
MCHC: 30.6 g/dL (ref 30.0–36.0)
MCV: 86.8 fL (ref 78.0–100.0)
Platelets: 222 10*3/uL (ref 150–400)
RBC: 4.78 MIL/uL (ref 3.87–5.11)
RDW: 14.6 % (ref 11.5–15.5)
WBC: 13.8 10*3/uL — AB (ref 4.0–10.5)

## 2016-12-02 LAB — ANGIOTENSIN CONVERTING ENZYME: ANGIOTENSIN-CONVERTING ENZYME: 44 U/L (ref 14–82)

## 2016-12-02 NOTE — Progress Notes (Signed)
SATURATION QUALIFICATIONS: (This note is used to comply with regulatory documentation for home oxygen)  Patient Saturations on Room Air at Rest = 85%  Patient Saturations on Room Air while Ambulating = 84%  Patient Saturations on  2 Liters of oxygen while Ambulating = 94%

## 2016-12-02 NOTE — Progress Notes (Signed)
  Echocardiogram 2D Echocardiogram has been performed.  Leta Jungling M 12/02/2016, 10:27 AM

## 2016-12-02 NOTE — Progress Notes (Signed)
PROGRESS NOTE    Deanna Allison  VOH:[id number] DOB: 29-May-1950 DOA: 11/30/2016 PCP: Angelica Chessman, MD  Brief Narrative:Deanna Allison is a 66 y.o. female with medical history significant of sarcoidosis followed by [facility] in [facility] admitted with increasing shortness of breath. According to the patient she gets one flare of sarcoidosis per year. But this year this is her second flare. She was recently on by mouth prednisone and the dose was tapered to 5 mg and then stopped a week ago. She started having increasing shortness of breath yesterday and got worse today and so she came to the ER. Her saturation was 70% on room air, chest x-ray did not reveal anything acute.   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 -CXR clear -improving with IV solumedrol, was just tapered off prednisone 1 week ago -also gived lasix x1 single dose yesterday, last CT chest 5/18 at [facility] with no adenopathy and concern for pulm HTN -FU ECHO to eval PA pressures    Essential hypertension -stable, continue verapamil    Fibromyalgia/chronic pain -continue oxycodone per home regimen   Von Willebrand disease -stopped lovenox  DVT prophylaxis: SCDs due to vW disease Code Status: FUll COde Family Communication: None at bedside Disposition Plan: Home later today vs 8/28   Subjective: Breathing improving, but not  Back to baseline yet  Objective: Vitals:   12/01/16 2130 12/01/16 2136 12/02/16 0444 12/02/16 1430  BP:  138/63 140/80 129/75  Pulse: 93 92 89 80  Resp: 20 18 18 18   Temp:  98.2 F (36.8 C) 98.2 F (36.8 C) 97.8 F (36.6 C)  TempSrc:  Oral Oral Axillary  SpO2: 93% 90% 95% 98%  Weight:      Height:        Intake/Output Summary (Last 24 hours) at 12/02/16 1528 Last data filed at 12/02/16 1000  Gross per 24 hour  Intake              240 ml  Output              700 ml  Net             -460 ml    Filed Weights   11/30/16 0925 11/30/16 1351  Weight: 93.9 kg (207 lb) 93.5 kg (206 lb 2.1 oz)    Examination:  General exam: AAOx3, no distress Respiratory system: improving air movement Cardiovascular system: S1 & S2 heard, RRR. No JVD, murmurs Gastrointestinal system: Abdomen is nondistended, soft and nontender. Normal bowel sounds heard. Central nervous system: Alert and oriented. No focal neurological deficits. Extremities: no edema Skin: No rashes, lesions or ulcers Psychiatry: Judgement and insight appear normal. Mood & affect appropriate.     Data Reviewed:   CBC:  Recent Labs Lab 11/30/16 0930 12/01/16 0514 12/02/16 0455  WBC 4.6 5.6 13.8*  NEUTROABS 2.9  --   --   HGB 13.1 13.3 12.7  HCT 42.5 42.5 41.5  MCV 86.4 85.3 86.8  PLT 198 225 222   Basic Metabolic Panel:  Recent Labs Lab 11/30/16 0930 12/01/16 0514 12/02/16 0455  NA 140 140 138  K 3.6 3.9 4.4  CL 102 102 101  CO2 31 32 32  GLUCOSE 120* 226* 274*  BUN 12 13 24*  CREATININE 0.79 0.70 0.83  CALCIUM 8.8* 9.2 9.4   GFR: Estimated Creatinine Clearance: 72.9 mL/min (by C-G formula based on SCr of 0.83 mg/dL). Liver Function  Tests:  Recent Labs Lab 12/01/16 0514  AST 18  ALT 10*  ALKPHOS 68  BILITOT 0.4  PROT 7.1  ALBUMIN 3.5   No results for input(s): LIPASE, AMYLASE in the last 168 hours. No results for input(s): AMMONIA in the last 168 hours. Coagulation Profile: No results for input(s): INR, PROTIME in the last 168 hours. Cardiac Enzymes:  Recent Labs Lab 11/30/16 0930  TROPONINI <0.03   BNP (last 3 results) No results for input(s): PROBNP in the last 8760 hours. HbA1C: No results for input(s): HGBA1C in the last 72 hours. CBG: No results for input(s): GLUCAP in the last 168 hours. Lipid Profile: No results for input(s): CHOL, HDL, LDLCALC, TRIG, CHOLHDL, LDLDIRECT in the last 72 hours. Thyroid Function Tests: No results for input(s): TSH, T4TOTAL, FREET4, T3FREE,  THYROIDAB in the last 72 hours. Anemia Panel: No results for input(s): VITAMINB12, FOLATE, FERRITIN, TIBC, IRON, RETICCTPCT in the last 72 hours. Urine analysis:    Component Value Date/Time   COLORURINE YELLOW 09/12/2015 1236   APPEARANCEUR CLEAR 09/12/2015 1236   LABSPEC 1.018 09/12/2015 1236   PHURINE 6.0 09/12/2015 1236   GLUCOSEU NEGATIVE 09/12/2015 1236   HGBUR NEGATIVE 09/12/2015 1236   BILIRUBINUR NEGATIVE 09/12/2015 1236   KETONESUR 15 (A) 09/12/2015 1236   PROTEINUR NEGATIVE 09/12/2015 1236   UROBILINOGEN 1.0 07/31/2014 1146   NITRITE NEGATIVE 09/12/2015 1236   LEUKOCYTESUR SMALL (A) 09/12/2015 1236   Sepsis Labs: @LABRCNTIP (procalcitonin:4,lacticidven:4)  )No results found for this or any previous visit (from the past 240 hour(s)).       Radiology Studies: No results found.      Scheduled Meds: . acidophilus  1 capsule Oral Daily  . dorzolamide  1 drop Both Eyes BID  . escitalopram  10 mg Oral Daily  . ketorolac  1 drop Both Eyes Daily  . latanoprost  1 drop Both Eyes QHS  . methylPREDNISolone (SOLU-MEDROL) injection  40 mg Intravenous Q6H  . pantoprazole  40 mg Oral Daily  . verapamil  180 mg Oral Daily   Continuous Infusions:   LOS: 2 days    Time spent:    Zannie Cove, MD [facility] Pager [phone]  If 7PM-7AM, please contact night-coverage [email or web address] Password [facility] 12/02/2016, 3:28 PM

## 2016-12-03 MED ORDER — VERAPAMIL HCL ER 240 MG PO TBCR: 240.0000 mg | EXTENDED_RELEASE_TABLET | Freq: Every day | ORAL | 0 refills | Status: DC

## 2016-12-03 MED ORDER — VERAPAMIL HCL ER 240 MG PO TBCR
240.0000 mg | EXTENDED_RELEASE_TABLET | Freq: Every day | ORAL | Status: DC
  Administered 2016-12-03: 240 mg via ORAL
  Filled 2016-12-03: qty 1

## 2016-12-03 MED ORDER — PREDNISONE 50 MG PO TABS
50.0000 mg | ORAL_TABLET | Freq: Every day | ORAL | Status: DC
  Administered 2016-12-03: 50 mg via ORAL
  Filled 2016-12-03: qty 1

## 2016-12-03 MED ORDER — PREDNISONE 20 MG PO TABS: ORAL_TABLET | ORAL | 0 refills | Status: DC

## 2016-12-03 NOTE — Care Management Note (Signed)
Case Management Note  Patient Details  Name: Deanna Allison MRN: [id number] Date of Birth: 04/25/1950  Subjective/Objective:                    Action/Plan:   Expected Discharge Date:  12/03/16               Expected Discharge Plan:  Home/Self Care  In-House Referral:     Discharge planning Services  CM Consult  Post Acute Care Choice:    Choice offered to:  Patient  DME Arranged:  Oxygen DME Agency:  [facility]  HH Arranged:    [facility] Agency:     Status of Service:  Completed, signed off  If discussed at Long Length of Stay Meetings, dates discussed:    Additional Comments:  Lanier Clam, RN 12/03/2016, 12:01 PM

## 2016-12-03 NOTE — Care Management Note (Signed)
Case Management Note  Patient Details  Name: Deanna Allison MRN: [id number] Date of Birth: 24-Jun-1950  Subjective/Objective: 02 sats qualify for home 02. Home 02 ordered-[facility] dem rep to [facility] 02 to rm prior d/c. No further CM needs.                   Action/Plan:d/c home w/home 02   Expected Discharge Date:  12/03/16               Expected Discharge Plan:  Home/Self Care  In-House Referral:     Discharge planning Services  CM Consult  Post Acute Care Choice:    Choice offered to:  Patient  DME Arranged:  Oxygen DME Agency:  [facility]  HH Arranged:    [facility] Agency:     Status of Service:  Completed, signed off  If discussed at Long Length of Stay Meetings, dates discussed:    Additional Comments:  Lanier Clam, RN 12/03/2016, 11:43 AM

## 2016-12-03 NOTE — Progress Notes (Signed)
SATURATION QUALIFICATIONS: (This note is used to comply with regulatory documentation for home oxygen)  Patient Saturations on Room Air at Rest = 85%  Patient Saturations on Room Air while Ambulating = 84%  Patient Saturations on 2 Liters of oxygen while Ambulating =94%  Please briefly explain why patient needs home oxygen: Other means of treatments have been ineffective for sarcoidosis. Addended for note on 12/02/16 by Sheppard Penton RN. Melton Alar, RN

## 2016-12-13 NOTE — Discharge Summary (Signed)
Physician Discharge Summary  Noga Fogg Allison ZOX:[id number] DOB: [birth year] DOA: 11/30/2016  PCP: Angelica Chessman, MD  Admit date: 11/30/2016 Discharge date: 12/03/2016  Time spent: 35 minutes  Recommendations for Outpatient Follow-up:  1. Pulm Dr[facility]Mark Doner in [facility] in 1-2weeks, prednisone taper at DC and needs further eval for Pulm HTN   Discharge Diagnoses:  Principal Problem:   Sarcoidosis of lung (HCC)   Moderate Pulm HTN   VW disease   Essential hypertension   Fibromyalgia   Hypoxia   Discharge Condition: stable  Diet recommendation: low sodium, heart healthy  Filed Weights   11/30/16 0925 11/30/16 1351  Weight: 93.9 kg (207 lb) 93.5 kg (206 lb 2.1 oz)    History of present illness:  Deanna Titzer Bishop-McKenzieis a 66 y.o.femalewith medical history significant of sarcoidosis followed by [facility] in [facility] admitted with increasing shortness of breath. According to the patient she gets one flare of sarcoidosis per year. But this year this is her second flare. She was recently on by mouth prednisone and the dose was tapered to 5 mg and then stopped a week ago. She started having increasing shortness of breath yesterday and got worse today and so she came to the ER. Her saturation was 70% on room air, chest x-ray did not reveal anything acute.  Hospital Course:    Sarcoidosis of lung with acute flare suspected -CXR clear -improving with IV solumedrol, was just tapered off prednisone 1 week ago -also gived lasix x1 single dose yesterday, [facility] CT [facility] 5/18 [facility] with no adenopathy and concern for pulm HTN -transitoned to Prednisone at discharge -2D ECHO noted moderate Pulm HTN, which is likely due to sarcoidosis/OSA, advised to FU with her Pulmonologist Dr.Mark Doner in [place], [place] to determine further taper of prednisone   Pulm Hypertension -moderate based on ECHO -FU with Pulmonologist in [place]    Essential  hypertension -stable, continue verapamil    Fibromyalgia/chronic pain -continue oxycodone per home regimen   Von Willebrand disease -stopped lovenox, caution with antiplatelets or anticoagulants  Procedures:  ECHO  Discharge Exam: Vitals:   12/02/16 2143 12/03/16 0520  BP:  (!) 161/87  Pulse:  82  Resp: 20 20  Temp:  97.9 F (36.6 C)  SpO2:  93%    General: AAOx3 Cardiovascular: S1S2./RRR Respiratory: CTAB  Discharge Instructions   Discharge Instructions    Diet - low sodium heart healthy    Complete by:  As directed    Increase activity slowly    Complete by:  As directed      Discharge Medication List as of 12/03/2016 11:12 AM    START taking these medications   Details  predniSONE (DELTASONE) 20 MG tablet Take  for 3days then  for 3days then  daily until FU with Pulmonologist, Print      CONTINUE these medications which have CHANGED   Details  verapamil (CALAN-SR) 240 MG CR tablet Take 1 tablet (240 mg total) by mouth daily., Starting Tue 12/03/2016, Print      CONTINUE these medications which have NOT CHANGED   Details  acetaminophen (TYLENOL) 500 MG tablet Take 500-1,000 mg by mouth every 6 (six) hours as needed for mild pain., Historical Med    dorzolamide (TRUSOPT) 2 % ophthalmic solution Place 1 drop into both eyes 2 (two) times daily., Until Discontinued, Historical Med    escitalopram (LEXAPRO) 10 MG tablet Take 10 mg by mouth daily., Starting Sat 02/03/2016, Historical Med  hydroxypropyl methylcellulose / hypromellose (ISOPTO TEARS / GONIOVISC) 2.5 % ophthalmic solution Place 1-2 drops into both eyes 3 (three) times daily as needed for dry eyes., Historical Med    ketorolac (ACULAR) 0.4 % SOLN Place 1 drop into both eyes daily. , Starting Sat 08/24/2016, Historical Med    latanoprost (XALATAN) 0.005 % ophthalmic solution INT 1 GTT INTO OU NIGHTLY, Historical Med    omeprazole (PRILOSEC) 40 MG capsule Take 40 mg by mouth daily.,  Historical Med    Oxycodone HCl 10 MG TABS TK 1 T PO Q 8 H PRN PAIN, Historical Med    Probiotic Product (PROBIOTIC DAILY PO) Take 1 tablet by mouth daily., Until Discontinued, Historical Med    zolpidem (AMBIEN) 5 MG tablet Take 2.5-5 mg by mouth at bedtime as needed for sleep. , Until Discontinued, Historical Med      STOP taking these medications     ciprofloxacin (CIPRO) 500 MG tablet      docusate sodium (COLACE) 100 MG capsule      metroNIDAZOLE (FLAGYL) 500 MG tablet      ondansetron (ZOFRAN) 4 MG tablet        Allergies  Allergen Reactions  . Benadryl [Diphenhydramine Hcl (Sleep)]     Hallucinations   . Cymbalta [Duloxetine Hcl] Nausea And Vomiting  . Gabapentin Other (See Comments)    Unknown  . Iodine Nausea And Vomiting  . Ivp Dye [Iodinated Diagnostic Agents]   . Lyrica [Pregabalin]     Depression  . Nsaids     Sits in my stomach  . Penicillins Hives    Has patient had a PCN reaction causing immediate rash, facial/tongue/throat swelling, SOB or lightheadedness with hypotension: No Has patient had a PCN reaction causing severe rash involving mucus membranes or skin necrosis: No Has patient had a PCN reaction that required hospitalization: No Has patient had a PCN reaction occurring within the last 10 years: No If all of the above answers are "NO", then may proceed with Cephalosporin use.   . Sulfamethoxazole Hives  . Nickel Rash   Follow-up Information    Doner, Mark, MD Follow up in 10 day(s).   Specialty:  Pulmonary Disease Contact information: [address] [place] [address] [phone]        [facility] - Dme Follow up.   Why:  home oxygen Contact information: [address] [place] [address] [phone]            The results of significant diagnostics from this hospitalization (including imaging, microbiology, ancillary and laboratory) are listed below for reference.    Significant  Diagnostic Studies: Dg Chest 2 View  Result Date: 11/30/2016 CLINICAL DATA:  Cough and shortness breath for several days. Hypoxia. Sarcoidosis. EXAM: CHEST  2 VIEW COMPARISON:  06/14/2014 FINDINGS: The heart size and mediastinal contours are within normal limits. Chronic elevation of right hemidiaphragm again seen with scarring in the right mid and lower lung. No evidence of pulmonary infiltrate or edema. No evidence of pneumothorax or pleural effusion. IMPRESSION: Stable elevation of right hemidiaphragm and right lower lung atelectasis versus scarring. No acute findings. Electronically Signed   By: Myles RosenthalJohn  Stahl M.D.   On: 11/30/2016 10:31    Microbiology: No results found for this or any previous visit (from the past 240 hour(s)).   Labs: Basic Metabolic Panel: No results for input(s): NA, K, CL, CO2, GLUCOSE, BUN, CREATININE, CALCIUM, MG, PHOS in the last 168 hours. Liver Function  Tests: No results for input(s): AST, ALT, ALKPHOS, BILITOT, PROT, ALBUMIN in the last 168 hours. No results for input(s): LIPASE, AMYLASE in the last 168 hours. No results for input(s): AMMONIA in the last 168 hours. CBC: No results for input(s): WBC, NEUTROABS, HGB, HCT, MCV, PLT in the last 168 hours. Cardiac Enzymes: No results for input(s): CKTOTAL, CKMB, CKMBINDEX, TROPONINI in the last 168 hours. BNP: BNP (last 3 results) No results for input(s): BNP in the last 8760 hours.  ProBNP (last 3 results) No results for input(s): PROBNP in the last 8760 hours.  CBG: No results for input(s): GLUCAP in the last 168 hours.     SignedZannie Cove MD.  [facility] 12/13/2016, 5:11 PM

## 2017-03-02 ENCOUNTER — Other Ambulatory Visit: Payer: Self-pay

## 2017-03-02 ENCOUNTER — Emergency Department (HOSPITAL_BASED_OUTPATIENT_CLINIC_OR_DEPARTMENT_OTHER)
Admission: EM | Admit: 2017-03-02 | Discharge: 2017-03-02 | Disposition: A | Discharge status: Home / Self Care | Payer: Medicare Other | Attending: Emergency Medicine | Admitting: Emergency Medicine

## 2017-03-02 ENCOUNTER — Encounter (HOSPITAL_BASED_OUTPATIENT_CLINIC_OR_DEPARTMENT_OTHER): Payer: Self-pay | Admitting: Emergency Medicine

## 2017-03-02 ENCOUNTER — Emergency Department (HOSPITAL_BASED_OUTPATIENT_CLINIC_OR_DEPARTMENT_OTHER): Payer: Medicare Other

## 2017-03-02 DIAGNOSIS — Z87891 Personal history of nicotine dependence: Secondary | ICD-10-CM | POA: Insufficient documentation

## 2017-03-02 DIAGNOSIS — Y999 Unspecified external cause status: Secondary | ICD-10-CM | POA: Diagnosis not present

## 2017-03-02 DIAGNOSIS — Z79899 Other long term (current) drug therapy: Secondary | ICD-10-CM | POA: Insufficient documentation

## 2017-03-02 DIAGNOSIS — E119 Type 2 diabetes mellitus without complications: Secondary | ICD-10-CM | POA: Insufficient documentation

## 2017-03-02 DIAGNOSIS — Y929 Unspecified place or not applicable: Secondary | ICD-10-CM | POA: Diagnosis not present

## 2017-03-02 DIAGNOSIS — I10 Essential (primary) hypertension: Secondary | ICD-10-CM | POA: Diagnosis not present

## 2017-03-02 DIAGNOSIS — S99922A Unspecified injury of left foot, initial encounter: Secondary | ICD-10-CM

## 2017-03-02 DIAGNOSIS — S90935A Unspecified superficial injury of left lesser toe(s), initial encounter: Secondary | ICD-10-CM | POA: Diagnosis present

## 2017-03-02 DIAGNOSIS — Y939 Activity, unspecified: Secondary | ICD-10-CM | POA: Insufficient documentation

## 2017-03-02 DIAGNOSIS — W2209XA Striking against other stationary object, initial encounter: Secondary | ICD-10-CM | POA: Diagnosis not present

## 2017-03-02 NOTE — ED Provider Notes (Signed)
[facility] EMERGENCY DEPARTMENT Provider Note   CSN: [id number] Arrival date & time: 03/02/17  1112     History   Chief Complaint Chief Complaint  Patient presents with  . Toe Injury    HPI Glennon HamiltonLynn M Bishop-McKenzie is a 66 y.o. female.  HPI   66 year old female presents today with complaints of toe injury.  Patient reports last night she had her left little toe on an iron rack.  She notes exquisite pain and slight bruising.  She denies any open wounds or deformities.  She notes taking oxycodone at home with symptomatic improvement.  She denies any other injuries.  Past Medical History:  Diagnosis Date  . Cataracts, bilateral   . Diabetes mellitus without complication (HCC)   . Diverticulitis   . Fatty liver   . Fibromyalgia   . Glaucoma   . Hearing loss of both ears   . Heart valve disorder    trace AR, mild PR, mild to mod MR/TR, EF 60% 09/22/12 echo ([facility])  . Hypertension    [facility]  . Kidney stone   . OSA (obstructive sleep apnea)    uses CPAP sleep study > 3 years ago  . PONV (postoperative nausea and vomiting)   . Pulmonary sarcoidosis (HCC)   . Von Willebrand disease [facility](HCC)     Patient Active Problem List   Diagnosis Date Noted  . Sarcoidosis of lung (HCC) 12/01/2016  . Hypoxia 11/30/2016  . Diverticulitis of large intestine without perforation or abscess without bleeding   . Fibromyalgia   . C. difficile colitis   . Acute diverticulitis 06/15/2014  . Nausea vomiting and diarrhea 06/15/2014  . Essential hypertension 06/15/2014  . Sleep apnea 06/15/2014  . Sigmoid diverticulitis   . Right kidney stone   . Vomiting 06/14/2014  . Loose stools 06/14/2014  . Diverticulitis 06/14/2014    Past Surgical History:  Procedure Laterality Date  . ABDOMINAL HYSTERECTOMY    . APPENDECTOMY    . BACK SURGERY    . CHOLECYSTECTOMY    . HAND SURGERY    . HERNIA REPAIR    . LUNG BIOPSY    . MULTIPLE EXTRACTIONS WITH  ALVEOLOPLASTY N/A 03/18/2013   Procedure: MULTIPLE SURGICAL TEETH EXTRACTIONS;  Surgeon: Hinton DyerJoseph L Miller, DDS;  Location: [facility] OR;  Service: Oral Surgery;  Laterality: N/A;  . TIBIA FRACTURE SURGERY      OB History    No data available       Home Medications    Prior to Admission medications   Medication Sig Start Date End Date Taking? Authorizing Provider  acetaminophen (TYLENOL) 500 MG tablet Take 500-1,000 mg by mouth every 6 (six) hours as needed for mild pain.    [provider]  dorzolamide (TRUSOPT) 2 % ophthalmic solution Place 1 drop into both eyes 2 (two) times daily.    [provider]  escitalopram (LEXAPRO) 10 MG tablet Take 10 mg by mouth daily. 02/03/16   [provider]  hydroxypropyl methylcellulose / hypromellose (ISOPTO TEARS / GONIOVISC) 2.5 % ophthalmic solution Place 1-2 drops into both eyes 3 (three) times daily as needed for dry eyes.    [provider]  ketorolac (ACULAR) 0.4 % SOLN Place 1 drop into both eyes daily.  08/24/16   [provider]  latanoprost (XALATAN) 0.005 % ophthalmic solution INT 1 GTT INTO OU NIGHTLY 08/28/16   [provider]  omeprazole (PRILOSEC) 40 MG capsule Take 40 mg by mouth daily.  [provider]  Oxycodone HCl 10 MG TABS TK 1 T PO Q 8 H PRN PAIN 11/02/16   [provider]  predniSONE (DELTASONE) 20 MG tablet Take 40mg  for 3days then 20mg  for 3days then 10mg  daily until FU with Pulmonologist 12/03/16   Zannie CoveJoseph, Preetha, MD  Probiotic Product (PROBIOTIC DAILY PO) Take 1 tablet by mouth daily.    [provider]  verapamil (CALAN-SR) 240 MG CR tablet Take 1 tablet (240 mg total) by mouth daily. 12/03/16   Zannie CoveJoseph, Preetha, MD  zolpidem (AMBIEN) 5 MG tablet Take 2.5-5 mg by mouth at bedtime as needed for sleep.     [provider]    Family History History reviewed. No pertinent family history.  Social History Social History   Tobacco Use  .  Smoking status: Former Smoker    Packs/day: 0.50    Years: 16.00    Pack years: 8.00    Last attempt to quit: 04/08/1976    Years since quitting: 40.9  . Smokeless tobacco: Never Used  Substance Use Topics  . Alcohol use: No  . Drug use: No     Allergies   Benadryl [diphenhydramine hcl (sleep)]; Cymbalta [duloxetine hcl]; Gabapentin; Iodine; Ivp dye [iodinated diagnostic agents]; Lyrica [pregabalin]; Nsaids; Penicillins; Sulfamethoxazole; and Nickel   Review of Systems Review of Systems  All other systems reviewed and are negative.    Physical Exam Updated Vital Signs BP 123/72 (BP Location: Right Arm)   Pulse 76   Temp 98.6 F (37 C) (Oral)   Resp 18   Ht 5' 3.5" (1.613 m)   Wt 93.9 kg (207 lb)   SpO2 90%   BMI 36.09 kg/m   Physical Exam  Constitutional: She is oriented to person, place, and time. She appears well-developed and well-nourished.  HENT:  Head: Normocephalic and atraumatic.  Eyes: Conjunctivae are normal. Pupils are equal, round, and reactive to light. Right eye exhibits no discharge. Left eye exhibits no discharge. No scleral icterus.  Neck: Normal range of motion. No JVD present. No tracheal deviation present.  Pulmonary/Chest: Effort normal. No stridor.  Musculoskeletal:  Left pinky toe with small bruising -no obvious deformities, no other injuries to the foot  Neurological: She is alert and oriented to person, place, and time. Coordination normal.  Psychiatric: She has a normal mood and affect. Her behavior is normal. Judgment and thought content normal.  Nursing note and vitals reviewed.    ED Treatments / Results  Labs (all labs ordered are listed, but only abnormal results are displayed) Labs Reviewed - No data to display  EKG  EKG Interpretation None       Radiology Dg Foot Complete Left  Result Date: 03/02/2017 CLINICAL DATA:  66 year old female with trauma to fifth digit last night. EXAM: LEFT FOOT - COMPLETE 3+ VIEW COMPARISON:   None. FINDINGS: There is no evidence of fracture or dislocation. There is no evidence of arthropathy or other focal bone abnormality. Soft tissues are unremarkable. IMPRESSION: Negative. Electronically Signed   By: Sande BrothersSerena  Chacko M.D.   On: 03/02/2017 11:51    Procedures Procedures (including critical care time)  Medications Ordered in ED Medications - No data to display   Initial Impression / Assessment and Plan / ED Course  I have reviewed the triage vital signs and the nursing notes.  Pertinent labs & imaging results that were available during my care of the patient were reviewed by me and considered in my medical decision making (see chart for  details).     Final Clinical Impressions(s) / ED Diagnoses   Final diagnoses:  Injury of toe on left foot, initial encounter    66 year old female presents today with toe injury no acute fracture small amount of bruising, no other injuries to note.  Symptomatic care instructions given.   ED Discharge Orders    None       Eyvonne Mechanic, Cordelia Poche 03/02/17 1534    Gwyneth Sprout, MD 03/02/17 2049

## 2017-03-02 NOTE — Discharge Instructions (Signed)
Please read attached information. If you experience any new or worsening signs or symptoms please return to the emergency room for evaluation. Please follow-up with your primary care provider or specialist as discussed.  °

## 2017-03-02 NOTE — ED Triage Notes (Signed)
Patient denies feeling SOB at this time. Patient reports that she normally wears O2 at home when she is SOB - normal o2 sat is 88-92

## 2017-03-02 NOTE — ED Triage Notes (Signed)
Patient states that she hit her left pink toe on a wrought iron shelf last night

## 2017-09-25 ENCOUNTER — Emergency Department (HOSPITAL_BASED_OUTPATIENT_CLINIC_OR_DEPARTMENT_OTHER): Payer: Medicare Other

## 2017-09-25 ENCOUNTER — Encounter (HOSPITAL_BASED_OUTPATIENT_CLINIC_OR_DEPARTMENT_OTHER): Payer: Self-pay | Admitting: Emergency Medicine

## 2017-09-25 ENCOUNTER — Emergency Department (HOSPITAL_BASED_OUTPATIENT_CLINIC_OR_DEPARTMENT_OTHER)
Admission: EM | Admit: 2017-09-25 | Discharge: 2017-09-25 | Disposition: A | Discharge status: Home / Self Care | Payer: Medicare Other | Attending: Emergency Medicine | Admitting: Emergency Medicine

## 2017-09-25 ENCOUNTER — Other Ambulatory Visit: Payer: Self-pay

## 2017-09-25 DIAGNOSIS — W01198A Fall on same level from slipping, tripping and stumbling with subsequent striking against other object, initial encounter: Secondary | ICD-10-CM | POA: Diagnosis not present

## 2017-09-25 DIAGNOSIS — Z87891 Personal history of nicotine dependence: Secondary | ICD-10-CM | POA: Insufficient documentation

## 2017-09-25 DIAGNOSIS — Z79899 Other long term (current) drug therapy: Secondary | ICD-10-CM | POA: Diagnosis not present

## 2017-09-25 DIAGNOSIS — S52591A Other fractures of lower end of right radius, initial encounter for closed fracture: Secondary | ICD-10-CM | POA: Insufficient documentation

## 2017-09-25 DIAGNOSIS — E119 Type 2 diabetes mellitus without complications: Secondary | ICD-10-CM | POA: Insufficient documentation

## 2017-09-25 DIAGNOSIS — Y939 Activity, unspecified: Secondary | ICD-10-CM | POA: Insufficient documentation

## 2017-09-25 DIAGNOSIS — Y998 Other external cause status: Secondary | ICD-10-CM | POA: Diagnosis not present

## 2017-09-25 DIAGNOSIS — S6991XA Unspecified injury of right wrist, hand and finger(s), initial encounter: Secondary | ICD-10-CM | POA: Diagnosis present

## 2017-09-25 DIAGNOSIS — S8000XA Contusion of unspecified knee, initial encounter: Secondary | ICD-10-CM | POA: Diagnosis not present

## 2017-09-25 DIAGNOSIS — Y92002 Bathroom of unspecified non-institutional (private) residence single-family (private) house as the place of occurrence of the external cause: Secondary | ICD-10-CM | POA: Diagnosis not present

## 2017-09-25 DIAGNOSIS — S52501A Unspecified fracture of the lower end of right radius, initial encounter for closed fracture: Secondary | ICD-10-CM

## 2017-09-25 DIAGNOSIS — I10 Essential (primary) hypertension: Secondary | ICD-10-CM | POA: Insufficient documentation

## 2017-09-25 DIAGNOSIS — W19XXXA Unspecified fall, initial encounter: Secondary | ICD-10-CM

## 2017-09-25 NOTE — ED Provider Notes (Signed)
[facility] EMERGENCY DEPARTMENT Provider Note   CSN: [id number] Arrival date & time: 09/25/17  [id number]     History   Chief Complaint Chief Complaint  Patient presents with  . Fall    HPI Deanna Allison is a 67 y.o. female.  Patient is a 67 year old female with history of pulmonary sarcoidosis, fibromyalgia.  She presents for evaluation of fall.  She was going into the bathroom this evening when she lost her balance and fell forward into the shower.  She injured both wrists and both knees.  The history is provided by the patient.  Fall  This is a new problem. The current episode started 1 to 2 hours ago. The problem occurs constantly. The problem has not changed since onset.Nothing aggravates the symptoms. Nothing relieves the symptoms. She has tried nothing for the symptoms.    Past Medical History:  Diagnosis Date  . Cataracts, bilateral   . Diabetes mellitus without complication (HCC)   . Diverticulitis   . Fatty liver   . Fibromyalgia   . Glaucoma   . Hearing loss of both ears   . Heart valve disorder    trace AR, mild PR, mild to mod MR/TR, EF 60% 09/22/12 echo ([facility])  . Hypertension    [facility]  . Kidney stone   . OSA (obstructive sleep apnea)    uses CPAP sleep study > 3 years ago  . PONV (postoperative nausea and vomiting)   . Pulmonary sarcoidosis (HCC)   . Von Willebrand disease [facility](HCC)     Patient Active Problem List   Diagnosis Date Noted  . Sarcoidosis of lung (HCC) 12/01/2016  . Hypoxia 11/30/2016  . Diverticulitis of large intestine without perforation or abscess without bleeding   . Fibromyalgia   . C. difficile colitis   . Acute diverticulitis 06/15/2014  . Nausea vomiting and diarrhea 06/15/2014  . Essential hypertension 06/15/2014  . Sleep apnea 06/15/2014  . Sigmoid diverticulitis   . Right kidney stone   . Vomiting 06/14/2014  . Loose stools 06/14/2014  . Diverticulitis 06/14/2014     Past Surgical History:  Procedure Laterality Date  . ABDOMINAL HYSTERECTOMY    . APPENDECTOMY    . BACK SURGERY    . CHOLECYSTECTOMY    . HAND SURGERY    . HERNIA REPAIR    . LUNG BIOPSY    . MULTIPLE EXTRACTIONS WITH ALVEOLOPLASTY N/A 03/18/2013   Procedure: MULTIPLE SURGICAL TEETH EXTRACTIONS;  Surgeon: Hinton DyerJoseph L Miller, DDS;  Location: [facility] OR;  Service: Oral Surgery;  Laterality: N/A;  . TIBIA FRACTURE SURGERY       OB History   None      Home Medications    Prior to Admission medications   Medication Sig Start Date End Date Taking? Authorizing Provider  acetaminophen (TYLENOL) 500 MG tablet Take 500-1,000 mg by mouth every 6 (six) hours as needed for mild pain.    [provider]  dorzolamide (TRUSOPT) 2 % ophthalmic solution Place 1 drop into both eyes 2 (two) times daily.    [provider]  escitalopram (LEXAPRO) 10 MG tablet Take 10 mg by mouth daily. 02/03/16   [provider]  hydroxypropyl methylcellulose / hypromellose (ISOPTO TEARS / GONIOVISC) 2.5 % ophthalmic solution Place 1-2 drops into both eyes 3 (three) times daily as needed for dry eyes.    [provider]  ketorolac (ACULAR) 0.4 % SOLN Place 1 drop into both eyes daily.  08/24/16  [provider]  latanoprost (XALATAN) 0.005 % ophthalmic solution INT 1 GTT INTO OU NIGHTLY 08/28/16   [provider]  omeprazole (PRILOSEC) 40 MG capsule Take 40 mg by mouth daily.    [provider]  Oxycodone HCl 10 MG TABS TK 1 T PO Q 8 H PRN PAIN 11/02/16   [provider]  predniSONE (DELTASONE) 20 MG tablet Take 40mg  for 3days then 20mg  for 3days then 10mg  daily until FU with Pulmonologist 12/03/16   Zannie Cove, MD  Probiotic Product (PROBIOTIC DAILY PO) Take 1 tablet by mouth daily.    [provider]  verapamil (CALAN-SR) 240 MG CR tablet Take 1 tablet (240 mg total) by mouth daily. 12/03/16   Zannie Cove, MD  zolpidem (AMBIEN) 5 MG  tablet Take 2.5-5 mg by mouth at bedtime as needed for sleep.     [provider]    Family History No family history on file.  Social History Social History   Tobacco Use  . Smoking status: Former Smoker    Packs/day: 0.50    Years: 16.00    Pack years: 8.00    Last attempt to quit: 04/08/1976    Years since quitting: 41.4  . Smokeless tobacco: Never Used  Substance Use Topics  . Alcohol use: No  . Drug use: No     Allergies   Benadryl [diphenhydramine hcl (sleep)]; Cymbalta [duloxetine hcl]; Gabapentin; Iodine; Ivp dye [iodinated diagnostic agents]; Lyrica [pregabalin]; Nsaids; Penicillins; Sulfamethoxazole; and Nickel   Review of Systems Review of Systems  All other systems reviewed and are negative.    Physical Exam Updated Vital Signs BP 136/64 (BP Location: Left Arm)   Pulse 70   Temp 98.7 F (37.1 C) (Oral)   Resp (!) 24   Ht 5' 3.5" (1.613 m)   Wt 90.3 kg (199 lb)   SpO2 94%   BMI 34.70 kg/m   Physical Exam  Constitutional: She is oriented to person, place, and time. She appears well-developed and well-nourished. No distress.  HENT:  Head: Normocephalic and atraumatic.  Neck: Normal range of motion. Neck supple.  Pulmonary/Chest: Effort normal.  Musculoskeletal: Normal range of motion.  Bilateral knees appear grossly normal.  There is no effusion or deformity.  She has good range of motion.  Knees appear stable anteriorly and posteriorly and with varus and valgus stress.  Bilateral wrists appear grossly normal.  There is no significant swelling or deformity.  Distal PMS is intact.  Neurological: She is alert and oriented to person, place, and time.  Skin: Skin is warm and dry. She is not diaphoretic.  Nursing note and vitals reviewed.    ED Treatments / Results  Labs (all labs ordered are listed, but only abnormal results are displayed) Labs Reviewed - No data to display  EKG None  Radiology Dg Wrist Complete Left  Result Date:  09/25/2017 CLINICAL DATA:  67 y/o F; recent fall with pain of the right wrist and medial aspect of the left wrist. EXAM: LEFT WRIST - COMPLETE 3+ VIEW; RIGHT WRIST - COMPLETE 3+ VIEW COMPARISON:  None. FINDINGS: Right wrist: Nondisplaced extra-articular acute fracture of the lateral aspect of the distal radius. Normal radiocarpal articulation. Mild osteoarthrosis of the basal joint. Left wrist: There is no evidence of fracture or dislocation. There is no evidence of arthropathy or other focal bone abnormality. Negative ulnar variance. IMPRESSION: 1. Nondisplaced extra-articular acute fracture of lateral aspect of distal radius. 2. No additional fracture or dislocation. 3. Left  negative ulnar variance. Electronically Signed   By: Mitzi Hansen M.D.   On: 09/25/2017 05:56   Dg Wrist Complete Right  Result Date: 09/25/2017 CLINICAL DATA:  67 y/o F; recent fall with pain of the right wrist and medial aspect of the left wrist. EXAM: LEFT WRIST - COMPLETE 3+ VIEW; RIGHT WRIST - COMPLETE 3+ VIEW COMPARISON:  None. FINDINGS: Right wrist: Nondisplaced extra-articular acute fracture of the lateral aspect of the distal radius. Normal radiocarpal articulation. Mild osteoarthrosis of the basal joint. Left wrist: There is no evidence of fracture or dislocation. There is no evidence of arthropathy or other focal bone abnormality. Negative ulnar variance. IMPRESSION: 1. Nondisplaced extra-articular acute fracture of lateral aspect of distal radius. 2. No additional fracture or dislocation. 3. Left negative ulnar variance. Electronically Signed   By: Mitzi Hansen M.D.   On: 09/25/2017 05:56   Dg Knee Complete 4 Views Left  Result Date: 09/25/2017 CLINICAL DATA:  67 y/o F; recent fall after losing balance. Bilateral anterior proximal tibia pain with small abrasions. EXAM: LEFT KNEE - COMPLETE 4+ VIEW COMPARISON:  None. FINDINGS: No evidence of fracture, dislocation, or joint effusion. No evidence of  arthropathy or other focal bone abnormality. Superior patellar enthesophyte. Bones are demineralized. Vascular calcifications. IMPRESSION: No acute fracture or dislocation identified. Electronically Signed   By: Mitzi Hansen M.D.   On: 09/25/2017 06:04   Dg Knee Complete 4 Views Right  Result Date: 09/25/2017 CLINICAL DATA:  67 y/o F; fall with bilateral anterior proximal tibia pain. EXAM: RIGHT KNEE - COMPLETE 4+ VIEW COMPARISON:  None. FINDINGS: No evidence of fracture, dislocation, or joint effusion. Mild narrowing of the medial femorotibial compartment. Suprapatellar enthesophyte. Well corticated bony body adjacent to the medial femoral epicondyle probably related to calcific tendinosis or old avulsion fracture of adductor magnus insertion. IMPRESSION: 1. No acute fracture or dislocation identified. 2. Osteoarthrosis with medial femorotibial compartment joint space narrowing. 3. Well corticated bony body adjacent to the medial femoral epicondyle probably related to calcific tendinosis or old avulsion fracture of adductor magnus insertion. Electronically Signed   By: Mitzi Hansen M.D.   On: 09/25/2017 06:10    Procedures Procedures (including critical care time)  Medications Ordered in ED Medications - No data to display   Initial Impression / Assessment and Plan / ED Course  I have reviewed the triage vital signs and the nursing notes.  Pertinent labs & imaging results that were available during my care of the patient were reviewed by me and considered in my medical decision making (see chart for details).  X-rays show no acute fracture of the knees or left wrist, but do show a possible fracture of the lateral aspect of the radius.  She does not appear to be significantly tender in this area, but will be placed in a wrist splint and advised to follow-up with orthopedics.  Final Clinical Impressions(s) / ED Diagnoses   Final diagnoses:  None    ED Discharge  Orders    None       Geoffery Lyons, MD 09/25/17 [phone]

## 2017-09-25 NOTE — Discharge Instructions (Addendum)
Follow-up with orthopedic surgery next week.  The contact information for Dr. Magnus IvanBlackman has been provided in this discharge summary for you to call and make these arrangements.  Wear wrist splint as applied until followed up by orthopedics.

## 2017-09-25 NOTE — ED Triage Notes (Signed)
Pt c/o bilateral wrist and knee pain 8/10 after having a fall on the bathroom today, pt denies LOC states she just lost her balance.

## 2017-10-02 ENCOUNTER — Ambulatory Visit (INDEPENDENT_AMBULATORY_CARE_PROVIDER_SITE_OTHER): Payer: Medicare Other | Admitting: Orthopaedic Surgery

## 2017-10-02 ENCOUNTER — Encounter (INDEPENDENT_AMBULATORY_CARE_PROVIDER_SITE_OTHER): Payer: Self-pay | Admitting: Orthopaedic Surgery

## 2017-10-02 ENCOUNTER — Ambulatory Visit (INDEPENDENT_AMBULATORY_CARE_PROVIDER_SITE_OTHER): Payer: Medicare Other

## 2017-10-02 DIAGNOSIS — M25531 Pain in right wrist: Secondary | ICD-10-CM

## 2017-10-02 NOTE — Progress Notes (Signed)
Office Visit Note   Patient: Deanna Allison           Date of Birth: 1951-01-05           MRN: [id number] Visit Date: 10/02/2017              Requested by: Angelica Chessman, MD [address] [place], [place] [address] PCP: Angelica Chessman, MD   Assessment & Plan: Visit Diagnoses:  1. Pain in right wrist     Plan: Impression is nondisplaced distal radius fracture on the right.  We will have the patient continue wearing her removable splint for the next 2 weeks.  She will remain nonweightbearing.  Follow-up with [place] in 2 weeks time for repeat evaluation x-ray.  Call with concerns or questions in the meantime.  Follow-Up Instructions: Return in about 2 weeks (around 10/16/2017).   Orders:  Orders Placed This Encounter  Procedures  . XR Wrist Complete Right   No orders of the defined types were placed in this encounter.     Procedures: No procedures performed   Clinical Data: No additional findings.   Subjective: Chief Complaint  Patient presents with  . Right Wrist - Pain, Injury, Follow-up    [name] 09/25/17, catching self with outstretched hands    HPI patient is a pleasant 67 year old female who presents to our clinic today with a new injury to her right wrist.  On 09/25/2017 she fell in the bathroom landing on outstretched hands.  She was seen in the ED where it was found that she had a nondisplaced distal radius fracture.  She was placed in a removable splint.  She comes in today for further evaluation treatment recommendation.  She has mild pain to the right wrist.  Minimal swelling.  She is taking an occasional Tylenol for this although she does admit to taking oxycodone for fibromyalgia.  Review of Systems as detailed in HPI.  All others reviewed and are negative.   Objective: Vital Signs: There were no vitals taken for this visit.  Physical Exam well-developed well-nourished female no acute distress.  Alert and oriented x3.  Ortho Exam  examination of her right wrist reveals moderate tenderness distal radius.  Fairly good range of motion in all planes.  Motor and sensory function intact distally.    Specialty Comments:  No specialty comments available.  Imaging: Xr Wrist Complete Right  Result Date: 10/02/2017 Nondisplaced distal radius fracture    PMFS History: Patient Active Problem List   Diagnosis Date Noted  . Pain in right wrist 10/02/2017  . Sarcoidosis of lung (HCC) 12/01/2016  . Hypoxia 11/30/2016  . Diverticulitis of large intestine without perforation or abscess without bleeding   . Fibromyalgia   . C. difficile colitis   . Acute diverticulitis 06/15/2014  . Nausea vomiting and diarrhea 06/15/2014  . Essential hypertension 06/15/2014  . Sleep apnea 06/15/2014  . Sigmoid diverticulitis   . Right kidney stone   . Vomiting 06/14/2014  . Loose stools 06/14/2014  . Diverticulitis 06/14/2014   Past Medical History:  Diagnosis Date  . Cataracts, bilateral   . Diabetes mellitus without complication (HCC)   . Diverticulitis   . Fatty liver   . Fibromyalgia   . Glaucoma   . Hearing loss of both ears   . Heart valve disorder    trace AR, mild PR, mild to mod MR/TR, EF 60% 09/22/12 echo ([facility])  . Hypertension    See [facility]  .  Kidney stone   . OSA (obstructive sleep apnea)    uses CPAP sleep study > 3 years ago  . PONV (postoperative nausea and vomiting)   . Pulmonary sarcoidosis (HCC)   . Von Willebrand disease (HCC)     History reviewed. No pertinent family history.  Past Surgical History:  Procedure Laterality Date  . ABDOMINAL HYSTERECTOMY    . APPENDECTOMY    . BACK SURGERY    . CHOLECYSTECTOMY    . HAND SURGERY    . HERNIA REPAIR    . LUNG BIOPSY    . MULTIPLE EXTRACTIONS WITH ALVEOLOPLASTY N/A 03/18/2013   Procedure: MULTIPLE SURGICAL TEETH EXTRACTIONS;  Surgeon: Hinton DyerJoseph L Miller, DDS;  Location: [facility] OR;  Service: Oral Surgery;  Laterality: N/A;  . TIBIA  FRACTURE SURGERY     Social History   Occupational History  . Not on file  Tobacco Use  . Smoking status: Former Smoker    Packs/day: 0.50    Years: 16.00    Pack years: 8.00    Last attempt to quit: 04/08/1976    Years since quitting: 41.5  . Smokeless tobacco: Never Used  Substance and Sexual Activity  . Alcohol use: No  . Drug use: No  . Sexual activity: Not on file

## 2017-10-16 ENCOUNTER — Ambulatory Visit (INDEPENDENT_AMBULATORY_CARE_PROVIDER_SITE_OTHER): Payer: Medicare Other

## 2017-10-16 ENCOUNTER — Ambulatory Visit (INDEPENDENT_AMBULATORY_CARE_PROVIDER_SITE_OTHER): Payer: Medicare Other | Admitting: Orthopaedic Surgery

## 2017-10-16 ENCOUNTER — Encounter (INDEPENDENT_AMBULATORY_CARE_PROVIDER_SITE_OTHER): Payer: Self-pay | Admitting: Orthopaedic Surgery

## 2017-10-16 DIAGNOSIS — S62647A Nondisplaced fracture of proximal phalanx of left little finger, initial encounter for closed fracture: Secondary | ICD-10-CM

## 2017-10-16 DIAGNOSIS — S52531D Colles' fracture of right radius, subsequent encounter for closed fracture with routine healing: Secondary | ICD-10-CM | POA: Diagnosis not present

## 2017-10-16 NOTE — Progress Notes (Signed)
Office Visit Note   Patient: Deanna Allison           Date of Birth: 08/23/1950           MRN: [id number] Visit Date: 10/16/2017              Requested by: Angelica ChessmanAguiar, Rafaela M, MD [address], [place] [id number] PCP: Angelica ChessmanAguiar, Rafaela M, MD   Assessment & Plan: Visit Diagnoses:  1. Closed Colles' fracture of right radius with routine healing, subsequent encounter   2. Closed nondisplaced fracture of proximal phalanx of left little finger, initial encounter     Plan: Impression is right nondisplaced distal radius fracture and left small finger proximal phalanx fracture.  Buddy tape was applied today.  Continue wearing the removable wrist brace.  Recheck in 3 weeks with 2 view x-rays of the right wrist and 2 view x-rays of the left small finger.  Follow-Up Instructions: Return in about 3 weeks (around 11/06/2017).   Orders:  Orders Placed This Encounter  Procedures  . XR Wrist Complete Right  . XR Finger Little Left   No orders of the defined types were placed in this encounter.     Procedures: No procedures performed   Clinical Data: No additional findings.   Subjective: Chief Complaint  Patient presents with  . Right Wrist - Pain, Follow-up  . Left Little Finger - Pain    Patient follows up today for her right distal radius fracture.  She is also complaining of pain in her left small finger.    Review of Systems   Objective: Vital Signs: There were no vitals taken for this visit.  Physical Exam  Ortho Exam Right wrist exam shows no significant pain or swelling. Left small finger exam shows tenderness of the proximal phalanx.  No clinical deformities.  She is able to make a full composite fist. Specialty Comments:  No specialty comments available.  Imaging: Xr Wrist Complete Right  Result Date: 10/16/2017 Stable nondisplaced distal radius fracture  Xr Finger Little Left  Result Date: 10/16/2017 Nondisplaced proximal phalanx  fracture with evidence of early healing.    PMFS History: Patient Active Problem List   Diagnosis Date Noted  . Pain in right wrist 10/02/2017  . Sarcoidosis of lung (HCC) 12/01/2016  . Hypoxia 11/30/2016  . Diverticulitis of large intestine without perforation or abscess without bleeding   . Fibromyalgia   . C. difficile colitis   . Acute diverticulitis 06/15/2014  . Nausea vomiting and diarrhea 06/15/2014  . Essential hypertension 06/15/2014  . Sleep apnea 06/15/2014  . Sigmoid diverticulitis   . Right kidney stone   . Vomiting 06/14/2014  . Loose stools 06/14/2014  . Diverticulitis 06/14/2014   Past Medical History:  Diagnosis Date  . Cataracts, bilateral   . Diabetes mellitus without complication (HCC)   . Diverticulitis   . Fatty liver   . Fibromyalgia   . Glaucoma   . Hearing loss of both ears   . Heart valve disorder    trace AR, mild PR, mild to mod MR/TR, EF 60% 09/22/12 echo ([facility])  . Hypertension    [facility]  . Kidney stone   . OSA (obstructive sleep apnea)    uses CPAP sleep study > 3 years ago  . PONV (postoperative nausea and vomiting)   . Pulmonary sarcoidosis (HCC)   . Von Willebrand disease (HCC)     No family history on file.  Past  Surgical History:  Procedure Laterality Date  . ABDOMINAL HYSTERECTOMY    . APPENDECTOMY    . BACK SURGERY    . CHOLECYSTECTOMY    . HAND SURGERY    . HERNIA REPAIR    . LUNG BIOPSY    . MULTIPLE EXTRACTIONS WITH ALVEOLOPLASTY N/A 03/18/2013   Procedure: MULTIPLE SURGICAL TEETH EXTRACTIONS;  Surgeon: Hinton Dyer, DDS;  Location: [facility] OR;  Service: Oral Surgery;  Laterality: N/A;  . TIBIA FRACTURE SURGERY     Social History   Occupational History  . Not on file  Tobacco Use  . Smoking status: Former Smoker    Packs/day: 0.50    Years: 16.00    Pack years: 8.00    Last attempt to quit: 04/08/1976    Years since quitting: 41.5  . Smokeless tobacco: Never Used  Substance and  Sexual Activity  . Alcohol use: No  . Drug use: No  . Sexual activity: Not on file

## 2017-11-06 ENCOUNTER — Ambulatory Visit (INDEPENDENT_AMBULATORY_CARE_PROVIDER_SITE_OTHER): Payer: Medicare Other | Admitting: Physician Assistant

## 2017-11-06 ENCOUNTER — Ambulatory Visit (INDEPENDENT_AMBULATORY_CARE_PROVIDER_SITE_OTHER): Payer: Medicare Other

## 2017-11-06 DIAGNOSIS — S52531D Colles' fracture of right radius, subsequent encounter for closed fracture with routine healing: Secondary | ICD-10-CM

## 2017-11-06 DIAGNOSIS — S52531A Colles' fracture of right radius, initial encounter for closed fracture: Secondary | ICD-10-CM | POA: Insufficient documentation

## 2017-11-06 DIAGNOSIS — M79645 Pain in left finger(s): Secondary | ICD-10-CM | POA: Insufficient documentation

## 2017-11-06 NOTE — Progress Notes (Signed)
Patient: Deanna Allison           Date of Birth: [birth year]           MRN: [id number] Visit Date: 11/06/2017 PCP: Angelica Chessman, MD   Assessment & Plan:  Chief Complaint:  Chief Complaint  Patient presents with  . Right Wrist - Pain, Follow-up   Visit Diagnoses:  1. Closed Colles' fracture of right radius with routine healing, subsequent encounter   2. Finger pain, left     Plan: Patient comes in today for follow-up of right nondisplaced distal radius fracture and left proximal phalanx fracture small finger date of injury 09/25/2017.  In regards to both injuries she is doing very well.  No pain.   examination of her left small finger reveals no tenderness to the fracture site.  No swelling.  She is able to make a full fist.  Examination of the right wrist shows no tenderness and no swelling.  Full range of motion.  She is neurovascularly intact distally.  At this point, both fractures are clinically healed.  She will advance with activity as tolerated and follow-up with [place] as needed.  Call with concerns or questions in the meantime.  Follow-Up Instructions: Return if symptoms worsen or fail to improve.   Orders:  Orders Placed This Encounter  Procedures  . XR Wrist 2 Views Right  . XR Finger Little Left   No orders of the defined types were placed in this encounter.   Imaging: Xr Finger Little Left  Result Date: 11/06/2017 Nearly healed proximal phalanx fracture  Xr Wrist 2 Views Right  Result Date: 11/06/2017 Healed distal radius fracture with stable alignment   PMFS History: Patient Active Problem List   Diagnosis Date Noted  . Fracture, Colles, right, closed 11/06/2017  . Finger pain, left 11/06/2017  . Pain in right wrist 10/02/2017  . Sarcoidosis of lung (HCC) 12/01/2016  . Hypoxia 11/30/2016  . Diverticulitis of large intestine without perforation or abscess without bleeding   . Fibromyalgia   . C. difficile colitis   . Acute diverticulitis  06/15/2014  . Nausea vomiting and diarrhea 06/15/2014  . Essential hypertension 06/15/2014  . Sleep apnea 06/15/2014  . Sigmoid diverticulitis   . Right kidney stone   . Vomiting 06/14/2014  . Loose stools 06/14/2014  . Diverticulitis 06/14/2014   Past Medical History:  Diagnosis Date  . Cataracts, bilateral   . Diabetes mellitus without complication (HCC)   . Diverticulitis   . Fatty liver   . Fibromyalgia   . Glaucoma   . Hearing loss of both ears   . Heart valve disorder    trace AR, mild PR, mild to mod MR/TR, EF 60% 09/22/12 echo ([facility])  . Hypertension    See [facility]  . Kidney stone   . OSA (obstructive sleep apnea)    uses CPAP sleep study > 3 years ago  . PONV (postoperative nausea and vomiting)   . Pulmonary sarcoidosis (HCC)   . Von Willebrand disease (HCC)     No family history on file.  Past Surgical History:  Procedure Laterality Date  . ABDOMINAL HYSTERECTOMY    . APPENDECTOMY    . BACK SURGERY    . CHOLECYSTECTOMY    . HAND SURGERY    . HERNIA REPAIR    . LUNG BIOPSY    . MULTIPLE EXTRACTIONS WITH ALVEOLOPLASTY N/A 03/18/2013   Procedure: MULTIPLE SURGICAL TEETH EXTRACTIONS;  Surgeon: Jomarie Longs  Stevie KernL Miller, DDS;  Location: [facility] OR;  Service: Oral Surgery;  Laterality: N/A;  . TIBIA FRACTURE SURGERY     Social History   Occupational History  . Not on file  Tobacco Use  . Smoking status: Former Smoker    Packs/day: 0.50    Years: 16.00    Pack years: 8.00    Last attempt to quit: 04/08/1976    Years since quitting: 41.6  . Smokeless tobacco: Never Used  Substance and Sexual Activity  . Alcohol use: No  . Drug use: No  . Sexual activity: Not on file

## 2019-11-15 ENCOUNTER — Other Ambulatory Visit: Payer: Self-pay | Admitting: Internal Medicine

## 2019-11-15 DIAGNOSIS — Z1231 Encounter for screening mammogram for malignant neoplasm of breast: Secondary | ICD-10-CM

## 2019-11-15 DIAGNOSIS — E559 Vitamin D deficiency, unspecified: Secondary | ICD-10-CM

## 2019-11-17 ENCOUNTER — Other Ambulatory Visit: Payer: Self-pay | Admitting: Internal Medicine

## 2019-11-17 DIAGNOSIS — M810 Age-related osteoporosis without current pathological fracture: Secondary | ICD-10-CM

## 2019-11-23 ENCOUNTER — Other Ambulatory Visit: Payer: Self-pay | Admitting: Nurse Practitioner

## 2019-11-23 ENCOUNTER — Ambulatory Visit
Admission: RE | Admit: 2019-11-23 | Discharge: 2019-11-23 | Disposition: A | Discharge status: Home / Self Care | Payer: Medicare (Managed Care) | Source: Ambulatory Visit | Attending: Nurse Practitioner | Admitting: Nurse Practitioner

## 2019-11-23 DIAGNOSIS — Z8719 Personal history of other diseases of the digestive system: Secondary | ICD-10-CM

## 2019-11-23 DIAGNOSIS — R1032 Left lower quadrant pain: Secondary | ICD-10-CM

## 2019-11-25 ENCOUNTER — Other Ambulatory Visit: Payer: Self-pay | Admitting: Nurse Practitioner

## 2019-11-25 ENCOUNTER — Ambulatory Visit: Payer: Medicare Other

## 2019-12-16 ENCOUNTER — Other Ambulatory Visit: Payer: Self-pay

## 2019-12-16 ENCOUNTER — Ambulatory Visit
Admission: RE | Admit: 2019-12-16 | Discharge: 2019-12-16 | Disposition: A | Discharge status: Home / Self Care | Payer: Medicare (Managed Care) | Source: Ambulatory Visit | Attending: Internal Medicine | Admitting: Internal Medicine

## 2019-12-16 DIAGNOSIS — Z1231 Encounter for screening mammogram for malignant neoplasm of breast: Secondary | ICD-10-CM

## 2019-12-30 ENCOUNTER — Other Ambulatory Visit: Payer: Self-pay | Admitting: Internal Medicine

## 2019-12-30 DIAGNOSIS — D860 Sarcoidosis of lung: Secondary | ICD-10-CM

## 2020-01-03 ENCOUNTER — Other Ambulatory Visit: Payer: Self-pay | Admitting: Internal Medicine

## 2020-01-03 DIAGNOSIS — D860 Sarcoidosis of lung: Secondary | ICD-10-CM

## 2020-02-14 ENCOUNTER — Ambulatory Visit
Admission: RE | Admit: 2020-02-14 | Discharge: 2020-02-14 | Disposition: A | Discharge status: Home / Self Care | Payer: Medicare (Managed Care) | Source: Ambulatory Visit | Attending: Internal Medicine | Admitting: Internal Medicine

## 2020-02-14 ENCOUNTER — Other Ambulatory Visit: Payer: Self-pay

## 2020-02-14 DIAGNOSIS — M810 Age-related osteoporosis without current pathological fracture: Secondary | ICD-10-CM

## 2020-03-08 ENCOUNTER — Ambulatory Visit
Admission: RE | Admit: 2020-03-08 | Discharge: 2020-03-08 | Disposition: A | Discharge status: Home / Self Care | Payer: Medicare (Managed Care) | Source: Ambulatory Visit | Attending: Internal Medicine | Admitting: Internal Medicine

## 2020-03-08 ENCOUNTER — Other Ambulatory Visit: Payer: Self-pay

## 2020-03-08 DIAGNOSIS — D860 Sarcoidosis of lung: Secondary | ICD-10-CM

## 2020-05-11 ENCOUNTER — Other Ambulatory Visit: Payer: Self-pay | Admitting: Internal Medicine

## 2020-05-11 ENCOUNTER — Other Ambulatory Visit: Payer: Self-pay

## 2020-05-11 ENCOUNTER — Ambulatory Visit
Admission: RE | Admit: 2020-05-11 | Discharge: 2020-05-11 | Disposition: A | Discharge status: Home / Self Care | Payer: Medicare (Managed Care) | Source: Ambulatory Visit | Attending: Internal Medicine | Admitting: Internal Medicine

## 2020-05-11 DIAGNOSIS — R0600 Dyspnea, unspecified: Secondary | ICD-10-CM

## 2020-05-11 DIAGNOSIS — R0902 Hypoxemia: Secondary | ICD-10-CM

## 2020-11-17 DIAGNOSIS — H409 Unspecified glaucoma: Secondary | ICD-10-CM | POA: Insufficient documentation

## 2020-11-17 DIAGNOSIS — E119 Type 2 diabetes mellitus without complications: Secondary | ICD-10-CM | POA: Insufficient documentation

## 2020-11-17 DIAGNOSIS — D6800 Von Willebrand disease, unspecified: Secondary | ICD-10-CM | POA: Insufficient documentation

## 2020-11-17 DIAGNOSIS — I38 Endocarditis, valve unspecified: Secondary | ICD-10-CM | POA: Insufficient documentation

## 2020-11-17 DIAGNOSIS — K760 Fatty (change of) liver, not elsewhere classified: Secondary | ICD-10-CM | POA: Insufficient documentation

## 2020-11-17 DIAGNOSIS — H9193 Unspecified hearing loss, bilateral: Secondary | ICD-10-CM | POA: Insufficient documentation

## 2020-11-17 DIAGNOSIS — H269 Unspecified cataract: Secondary | ICD-10-CM | POA: Insufficient documentation

## 2020-11-20 ENCOUNTER — Other Ambulatory Visit: Payer: Self-pay

## 2020-11-23 ENCOUNTER — Ambulatory Visit (INDEPENDENT_AMBULATORY_CARE_PROVIDER_SITE_OTHER): Payer: Medicare (Managed Care) | Admitting: Cardiology

## 2020-11-23 ENCOUNTER — Other Ambulatory Visit: Payer: Self-pay

## 2020-11-23 ENCOUNTER — Encounter: Payer: Self-pay | Admitting: Cardiology

## 2020-11-23 VITALS — BP 116/72 | HR 74 | Ht 63.0 in | Wt 169.1 lb

## 2020-11-23 DIAGNOSIS — R079 Chest pain, unspecified: Secondary | ICD-10-CM | POA: Diagnosis not present

## 2020-11-23 DIAGNOSIS — G4730 Sleep apnea, unspecified: Secondary | ICD-10-CM

## 2020-11-23 DIAGNOSIS — I498 Other specified cardiac arrhythmias: Secondary | ICD-10-CM

## 2020-11-23 DIAGNOSIS — I10 Essential (primary) hypertension: Secondary | ICD-10-CM

## 2020-11-23 DIAGNOSIS — I2510 Atherosclerotic heart disease of native coronary artery without angina pectoris: Secondary | ICD-10-CM | POA: Diagnosis not present

## 2020-11-23 DIAGNOSIS — E119 Type 2 diabetes mellitus without complications: Secondary | ICD-10-CM

## 2020-11-23 MED ORDER — ROSUVASTATIN CALCIUM 10 MG PO TABS: 10.0000 mg | ORAL_TABLET | Freq: Every day | ORAL | 3 refills | Status: DC

## 2020-11-23 MED ORDER — METOPROLOL TARTRATE 25 MG PO TABS: ORAL_TABLET | ORAL | 0 refills | Status: AC

## 2020-11-23 NOTE — Progress Notes (Signed)
Cardiology Office Note:    Date:  11/24/2020   ID:  Deanna Allison, DOB [birth year], MRN [id number]  PCP:  Angelica Chessman, MD  Cardiologist:  Thomasene Ripple, DO  Electrophysiologist:  None   Referring MD: Jethro Bastos, MD    History of Present Illness:    Deanna Allison is a 70 y.o. female with a hx of pulmonary sarcoidosis, von willebrand disease, fibromyalgia, diverticulitis, and steroid-induced diabetes and hypertension. Patient presents after recent hospitalization 1 week ago at [facility] for chest pain.  The pain started in her L shoulder. She applied Bengay and went to sleep. Patient then woke from sleep with left sided chest pain, described as an elephant sitting on her chest. The pain lasted a few hours. No associated diaphoresis, dyspnea, lightheaded/dizziness, nausea, etc. Workup in the hospital included negative troponins and overall unremarkable EKG, but a CTA of her chest (ordered to rule out PE) showed coronary artery calcifications (of LAD and RCA) and aortic atherosclerosis.  Patient has not had any other episodes of chest pain. She denies any history of dyspnea, palpitations, dizziness, lightheadedness, lower extremity swelling or other complaints.  She does not smoke. With regard to her diagnoses of hypertension and diabetes, patient states she only has these when she's on steroids for her sarcoidosis flares. No significant cardiac history in her family.   Past Medical History:  Diagnosis Date   Cataracts, bilateral    Diabetes mellitus without complication (HCC)    Diverticulitis    Fatty liver    Fibromyalgia    Glaucoma    Hearing loss of both ears    Heart valve disorder    trace AR, mild PR, mild to mod MR/TR, EF 60% 09/22/12 echo ([facility])   Hypertension    See [facility] at [facility]   Kidney stone    OSA (obstructive sleep apnea)    uses CPAP sleep study > 3 years ago   PONV (postoperative nausea and vomiting)     Pulmonary sarcoidosis (HCC)    Von Willebrand disease (HCC)     Past Surgical History:  Procedure Laterality Date   ABDOMINAL HYSTERECTOMY     APPENDECTOMY     BACK SURGERY     CHOLECYSTECTOMY     HAND SURGERY     HERNIA REPAIR     LUNG BIOPSY     MULTIPLE EXTRACTIONS WITH ALVEOLOPLASTY N/A 03/18/2013   Procedure: MULTIPLE SURGICAL TEETH EXTRACTIONS;  Surgeon: Hinton Dyer, DDS;  Location: [facility] OR;  Service: Oral Surgery;  Laterality: N/A;   TIBIA FRACTURE SURGERY      Current Medications: Current Meds  Medication Sig   acetaminophen (TYLENOL) 500 MG tablet Take 500-1,000 mg by mouth every 6 (six) hours as needed for mild pain.   Cholecalciferol 50 MCG (2000 UT) TABS Take 50 mcg by mouth daily.   donepezil (ARICEPT) 10 MG tablet Take 10 mg by mouth at bedtime.   dorzolamide (TRUSOPT) 2 % ophthalmic solution Place 1 drop into both eyes 2 (two) times daily.   dorzolamide-timolol (COSOPT) 22.3-6.8 MG/ML ophthalmic solution Place 1 drop into both eyes 2 (two) times daily.   famotidine (PEPCID) 40 MG tablet Take 40 mg by mouth at bedtime.   FLUoxetine (PROZAC) 20 MG capsule Take 20 mg by mouth daily.   furosemide (LASIX) 20 MG tablet Take 1.5 tablets by mouth daily.   hydroxypropyl methylcellulose / hypromellose (ISOPTO TEARS / GONIOVISC) 2.5 % ophthalmic solution Place 1-2 drops into both eyes  3 (three) times daily as needed for dry eyes.   ketorolac (ACULAR) 0.4 % SOLN Place 1 drop into both eyes daily.    latanoprost (XALATAN) 0.005 % ophthalmic solution INT 1 GTT INTO OU NIGHTLY   memantine (NAMENDA) 10 MG tablet Take 10 mg by mouth in the morning and at bedtime.   metoprolol tartrate (LOPRESSOR) 25 MG tablet Take 2 hours before CT scan.   Multiple Vitamin (MULTI-VITAMIN) tablet Take 1 tablet by mouth daily.   mycophenolate (CELLCEPT) 250 MG capsule Take 1 capsule by mouth in the morning and at bedtime.   omeprazole (PRILOSEC) 40 MG capsule Take 40 mg by mouth daily.    prednisoLONE acetate (PRED FORTE) 1 % ophthalmic suspension Place 1 drop into the right eye in the morning, at noon, in the evening, and at bedtime.   rosuvastatin (CRESTOR) 10 MG tablet Take 1 tablet (10 mg total) by mouth daily.   traMADol (ULTRAM) 50 MG tablet Take 50 mg by mouth every 6 (six) hours as needed for pain.   zolpidem (AMBIEN) 5 MG tablet Take 5 mg by mouth at bedtime as needed for sleep.     Allergies:   Benadryl [diphenhydramine hcl (sleep)], Brimonidine, Cymbalta [duloxetine hcl], Duloxetine, Gabapentin, Iodine, Lyrica [pregabalin], Nsaids, Penicillins, Sulfamethoxazole, Iodinated diagnostic agents, and Nickel   Social History   Socioeconomic History   Marital status: Married    Spouse name: Not on file   Number of children: Not on file   Years of education: Not on file   Highest education level: Not on file  Occupational History   Not on file  Tobacco Use   Smoking status: Former    Packs/day: 0.50    Years: 16.00    Pack years: 8.00    Types: Cigarettes    Quit date: 04/08/1976    Years since quitting: 44.6   Smokeless tobacco: Never  Substance and Sexual Activity   Alcohol use: No   Drug use: No   Sexual activity: Not on file  Other Topics Concern   Not on file  Social History Narrative   Not on file   Social Determinants of Health   Financial Resource Strain: Not on file  Food Insecurity: Not on file  Transportation Needs: Not on file  Physical Activity: Not on file  Stress: Not on file  Social Connections: Not on file     Family History: The patient's family history includes Brain cancer in her mother; Diabetes in her mother; Heart disease in her mother; Hodgkin's lymphoma in her brother; Hypertension in her mother; Kidney cancer in her sister; Pancreatic cancer in her father; Raynaud syndrome in her daughter.  ROS:   Review of Systems  Constitution: Negative for decreased appetite, fever and weight gain.  HENT: Negative for congestion, ear  discharge, hoarse voice and sore throat.   Eyes: Negative for discharge, redness, vision loss in right eye and visual halos.  Cardiovascular: Negative for chest pain currently. No dyspnea on exertion, leg swelling, orthopnea or palpitations.  Respiratory: Negative for cough, hemoptysis, shortness of breath and snoring.   Endocrine: Negative for heat intolerance and polyphagia.  Hematologic/Lymphatic: Negative for bleeding problem. Does not bruise/bleed easily.  Skin: Negative for flushing, nail changes, rash and suspicious lesions.  Musculoskeletal: Negative for arthritis, muscle cramps, neck pain and stiffness.  Gastrointestinal: Negative for abdominal pain, bowel incontinence, diarrhea and excessive appetite.  Genitourinary: Negative for decreased libido, genital sores and incomplete emptying.  Neurological: Negative for brief paralysis, focal weakness,  headaches and loss of balance.  Psychiatric/Behavioral: Negative for altered mental status, depression and suicidal ideas.  Allergic/Immunologic: Negative for HIV exposure and persistent infections.    EKGs/Labs/Other Studies Reviewed:    The following studies were reviewed today: CTA chest (PE study) with contrast Impression:  No evidence of pulmonary embolism.  Severe dilatation of the pulmonic trunk, concerning for pulmonary arterial hypertension Aortic atherosclerosis, in addition to two vessel coronary artery disease. Assessment for potential risk factor modification, dietary therapy or pharmacologic therapy may be warranted, if clinically indicated The appearance of the lungs suggests extensive air trapping from small airways disease, as above.   EKG:  The ekg ordered today demonstrates normal sinus rhythm, normal axis, normal intervals. Nonspecific T wave inversions in anterior leads.  Recent Labs:  A1c 11/12/2020-  5.7% Lipid panel 11/12/2020- total cholesterol 139, triglycerides 137, HDL43, LDL 69   Physical Exam:    VS:  BP  116/72 (BP Location: Left Arm, Patient Position: Sitting, Cuff Size: Normal)   Pulse 74   Ht 5\' 3"  (1.6 m)   Wt 169 lb 1.3 oz (76.7 kg)   SpO2 98%   BMI 29.95 kg/m     Wt Readings from Last 3 Encounters:  11/23/20 169 lb 1.3 oz (76.7 kg)  09/25/17 199 lb (90.3 kg)  03/02/17 207 lb (93.9 kg)     GEN: Well nourished, well developed in no acute distress HEENT: Normal NECK: No JVD; No carotid bruits LYMPHATICS: No lymphadenopathy CARDIAC: S1S2 noted,RRR, no murmurs, rubs, gallops RESPIRATORY:  Clear to auscultation without rales, wheezing or rhonchi  ABDOMEN: Soft, non-tender, non-distended, +bowel sounds, no guarding. EXTREMITIES: No edema, No cyanosis, no clubbing MUSCULOSKELETAL:  No deformity  SKIN: Warm and dry NEUROLOGIC:  Alert and oriented x 3, non-focal PSYCHIATRIC:  Normal affect, good insight  ASSESSMENT:    1. Coronary artery calcification seen on CT scan   2. Chest pain of uncertain etiology   3. Sinus arrhythmia   4. Essential hypertension   5. Sleep apnea, unspecified type   6. Diabetes mellitus without complication (HCC)    PLAN:     Coronary artery calcifications: seen on recent CTA of the chest. Per records in care everywhere, radiology report reads "there is aortic atherosclerosis, as well as atherosclerosis of the great vessels of the mediastinum and the coronary arteries, including calcified atherosclerotic plaque in the left anterior descending and right coronary arteries". Given these findings, patient warrants further evaluation with coronary CT to better characterize the degree of calcification, would favor coronary CT over nuclear stress test given her history of sarcoidosis and high probability for positive nuclear stress test in this patient population. Will start Crestor 10mg  daily for secondary prevention.  I am holding off on the use of aspirin and review of her testing reports.  This is due to the patient history of von Willebrand's syndrome and high  risk of bleeding on antiplatelet therapy.  Chest pain: isolated incident 1 week ago, at which time troponins were negative and EKG overall unremarkable. However, given this episode of chest pain and the concern for coronary artery disease as mentioned above, will proceed with coronary CT.  3.  Diabetes mellitus will be managed by her primary provider.  Blood pressure susceptible in the office today.  Continue use of CPAP.  The patient is in agreement with the above plan. The patient left the office in stable condition.  The patient will follow up in 3 months.   Medication Adjustments/Labs and Tests Ordered:  Current medicines are reviewed at length with the patient today.  Concerns regarding medicines are outlined above.  Orders Placed This Encounter  Procedures   CT CORONARY MORPH W/CTA COR W/SCORE W/CA W/CM &/OR WO/CM   Basic metabolic panel   Magnesium   EKG 12-Lead    Meds ordered this encounter  Medications   metoprolol tartrate (LOPRESSOR) 25 MG tablet    Sig: Take 2 hours before CT scan.    Dispense:  1 tablet    Refill:  0   rosuvastatin (CRESTOR) 10 MG tablet    Sig: Take 1 tablet (10 mg total) by mouth daily.    Dispense:  90 tablet    Refill:  3     Patient Instructions  Medication Instructions:  Your physician has recommended you make the following change in your medication:  START: Crestor 10 mg once daily *If you need a refill on your cardiac medications before your next appointment, please call your pharmacy*   Lab Work: Your physician recommends that you return for lab work in:  3-7 days before CT scan: BMET, Mag If you have labs (blood work) drawn today and your tests are completely normal, you will receive your results only by: MyChart Message (if you have MyChart) OR A paper copy in the mail If you have any lab test that is abnormal or we need to change your treatment, we will call you to review the results.   Testing/Procedures:   Your cardiac  CT will be scheduled at one of the below locations:   [facility] [address] [place], [place] [address] [phone]  If scheduled at [facility], please arrive at the [facility] main entrance (entrance A) of [facility] 30 minutes prior to test start time. Proceed to the [facility] Radiology Department (first floor) to check-in and test prep.   Please follow these instructions carefully (unless otherwise directed):   On the Night Before the Test: Be sure to Drink plenty of water. Do not consume any caffeinated/decaffeinated beverages or chocolate 12 hours prior to your test. Do not take any antihistamines 12 hours prior to your test.   On the Day of the Test: Drink plenty of water until 1 hour prior to the test. Do not eat any food 4 hours prior to the test. You may take your regular medications prior to the test.  Take metoprolol (Lopressor) two hours prior to test. HOLD Furosemide (Lasix) morning of the test. FEMALES- please wear underwire-free bra if available, avoid dresses & tight clothing       After the Test: Drink plenty of water. After receiving IV contrast, you may experience a mild flushed feeling. This is normal. On occasion, you may experience a mild rash up to 24 hours after the test. This is not dangerous. If this occurs, you can take Benadryl 25 mg and increase your fluid intake. If you experience trouble breathing, this can be serious. If it is severe call 911 IMMEDIATELY. If it is mild, please call our office. If you take any of these medications: Glipizide/Metformin, Avandament, Glucavance, please do not take 48 hours after completing test unless otherwise instructed.  Please allow 2-4 weeks for scheduling of routine cardiac CTs. Some insurance companies require a pre-authorization which may delay scheduling of this test.   For non-scheduling related questions, please contact the cardiac imaging nurse navigator should you  have any questions/concerns: Rockwell Alexandria, Cardiac Imaging Nurse Navigator Larey Brick, Cardiac Imaging Nurse Navigator  [facility] Direct Office Dial: [phone]   For scheduling needs, including cancellations and rescheduling, please call [place], [phone].    Follow-Up: At [facility], you and your health needs are our priority.  As part of our continuing mission to provide you with exceptional heart care, we have created designated Provider Care Teams.  These Care Teams include your primary Cardiologist (physician) and Advanced Practice Providers (APPs -  Physician Assistants and Nurse Practitioners) who all work together to provide you with the care you need, when you need it.  We recommend signing up for the patient portal called "MyChart".  Sign up information is provided on this After Visit Summary.  MyChart is used to connect with patients for Virtual Visits (Telemedicine).  Patients are able to view lab/test results, encounter notes, upcoming appointments, etc.  Non-urgent messages can be sent to your provider as well.   To learn more about what you can do with MyChart, go to [email or web address].    Your next appointment:   3 month(s)  The format for your next appointment:   In Person  Provider:   Elease Hashimoto - Thomasene Ripple, DO [address], [place], [place] [address]    Other Instructions    Adopting a Healthy Lifestyle.  Know what a healthy weight is for you (roughly BMI <25) and aim to maintain this   Aim for 7+ servings of fruits and vegetables daily   65-80+ fluid ounces of water or unsweet tea for healthy kidneys   Limit to max 1 drink of alcohol per day; avoid smoking/tobacco   Limit animal fats in diet for cholesterol and heart health - choose grass fed whenever available   Avoid highly processed foods, and foods high in saturated/trans fats   Aim for low stress - take time to unwind and care for your  mental health   Aim for 150 min of moderate intensity exercise weekly for heart health, and weights twice weekly for bone health   Aim for 7-9 hours of sleep daily   When it comes to diets, agreement about the perfect plan isnt easy to find, even among the experts. Experts at the [facility] [employer] developed an idea known as the Healthy Eating Plate. Just imagine a plate divided into logical, healthy portions.   The emphasis is on diet quality:   Load up on vegetables and fruits - one-half of your plate: Aim for color and variety, and remember that potatoes dont count.   Go for whole grains - one-quarter of your plate: Whole wheat, barley, wheat berries, quinoa, oats, brown rice, and foods made with them. If you want pasta, go with whole wheat pasta.   Protein power - one-quarter of your plate: Fish, chicken, beans, and nuts are all healthy, versatile protein sources. Limit red meat.   The diet, however, does go beyond the plate, offering a few other suggestions.   Use healthy plant oils, such as olive, canola, soy, corn, sunflower and peanut. Check the labels, and avoid partially hydrogenated oil, which have unhealthy trans fats.   If youre thirsty, drink water. Coffee and tea are good in moderation, but skip sugary drinks and limit milk and dairy products to one or two daily servings.   The type of carbohydrate in the diet is more important than the amount. Some sources of carbohydrates, such as vegetables, fruits, whole grains, and beans-are healthier than others.   Finally, stay active  Signed, Kardie Tobb, DO  11/24/2020 12:48 PM    [facility]

## 2020-11-23 NOTE — Patient Instructions (Addendum)
Medication Instructions:  Your physician has recommended you make the following change in your medication:  START: Crestor 10 mg once daily *If you need a refill on your cardiac medications before your next appointment, please call your pharmacy*   Lab Work: Your physician recommends that you return for lab work in:  3-7 days before CT scan: BMET, Mag If you have labs (blood work) drawn today and your tests are completely normal, you will receive your results only by: MyChart Message (if you have MyChart) OR A paper copy in the mail If you have any lab test that is abnormal or we need to change your treatment, we will call you to review the results.   Testing/Procedures:   Your cardiac CT will be scheduled at one of the below locations:   [facility] [address] [place], [place] [address] [phone]  If scheduled at [facility], please arrive at the [facility] main entrance (entrance A) of [facility] 30 minutes prior to test start time. Proceed to the [facility] Radiology Department (first floor) to check-in and test prep.   Please follow these instructions carefully (unless otherwise directed):   On the Night Before the Test: Be sure to Drink plenty of water. Do not consume any caffeinated/decaffeinated beverages or chocolate 12 hours prior to your test. Do not take any antihistamines 12 hours prior to your test.   On the Day of the Test: Drink plenty of water until 1 hour prior to the test. Do not eat any food 4 hours prior to the test. You may take your regular medications prior to the test.  Take metoprolol (Lopressor) two hours prior to test. HOLD Furosemide (Lasix) morning of the test. FEMALES- please wear underwire-free bra if available, avoid dresses & tight clothing       After the Test: Drink plenty of water. After receiving IV contrast, you may experience a mild flushed feeling. This is normal. On occasion, you may  experience a mild rash up to 24 hours after the test. This is not dangerous. If this occurs, you can take Benadryl 25 mg and increase your fluid intake. If you experience trouble breathing, this can be serious. If it is severe call 911 IMMEDIATELY. If it is mild, please call our office. If you take any of these medications: Glipizide/Metformin, Avandament, Glucavance, please do not take 48 hours after completing test unless otherwise instructed.  Please allow 2-4 weeks for scheduling of routine cardiac CTs. Some insurance companies require a pre-authorization which may delay scheduling of this test.   For non-scheduling related questions, please contact the cardiac imaging nurse navigator should you have any questions/concerns: Rockwell Alexandria, Cardiac Imaging Nurse Navigator Larey Brick, Cardiac Imaging Nurse Navigator [facility] Direct Office Dial: [phone]   For scheduling needs, including cancellations and rescheduling, please call [place], [phone].    Follow-Up: At [facility], you and your health needs are our priority.  As part of our continuing mission to provide you with exceptional heart care, we have created designated Provider Care Teams.  These Care Teams include your primary Cardiologist (physician) and Advanced Practice Providers (APPs -  Physician Assistants and Nurse Practitioners) who all work together to provide you with the care you need, when you need it.  We recommend signing up for the patient portal called "MyChart".  Sign up information is provided on this After Visit Summary.  MyChart is used to connect with patients for Virtual Visits (  Telemedicine).  Patients are able to view lab/test results, encounter notes, upcoming appointments, etc.  Non-urgent messages can be sent to your provider as well.   To learn more about what you can do with MyChart, go to [email or web address].    Your next appointment:   3 month(s)  The  format for your next appointment:   In Person  Provider:   Elease Hashimoto - Thomasene Ripple, DO [address], [place], [place] [address]    Other Instructions

## 2020-11-28 ENCOUNTER — Telehealth (HOSPITAL_COMMUNITY): Payer: Self-pay | Admitting: *Deleted

## 2020-11-28 NOTE — Telephone Encounter (Signed)
Patient returning call regarding upcoming cardiac imaging study; pt verbalizes understanding of appt date/time, but states her medications for the test have home come in via mail.  We have re-scheduled her to allow for medications to arrive.  Larey Brick RN Navigator Cardiac Imaging [facility] [phone] office [phone] cell

## 2020-11-28 NOTE — Telephone Encounter (Signed)
Attempted to call patient regarding upcoming cardiac CT appointment. °Left message on voicemail with name and callback number ° °Alysiana Ethridge RN Navigator Cardiac Imaging °[facility] °[phone] Office °[phone] Cell ° °

## 2020-11-29 ENCOUNTER — Telehealth: Payer: Self-pay

## 2020-11-29 MED ORDER — ONDANSETRON HCL 4 MG PO TABS: 4.0000 mg | ORAL_TABLET | Freq: Three times a day (TID) | ORAL | 0 refills | Status: AC

## 2020-11-29 NOTE — Telephone Encounter (Signed)
Called pt to let her know the prescription is sitting at the front desk for her. She verbalized understanding.

## 2020-11-29 NOTE — Telephone Encounter (Signed)
Called pt to check about Zofran allergies. She is not allergic. Pt will need to come by to get prescription from provider in the [place] office today, since Dr. Servando Salina is out of office this week.

## 2020-11-30 ENCOUNTER — Ambulatory Visit (HOSPITAL_COMMUNITY): Admission: RE | Admit: 2020-11-30 | Payer: Medicare (Managed Care) | Source: Ambulatory Visit

## 2020-12-04 ENCOUNTER — Telehealth: Payer: Self-pay | Admitting: Cardiology

## 2020-12-04 ENCOUNTER — Telehealth (HOSPITAL_COMMUNITY): Payer: Self-pay | Admitting: *Deleted

## 2020-12-04 NOTE — Telephone Encounter (Signed)
Reaching out to patient to offer assistance regarding upcoming cardiac imaging study; pt verbalizes understanding of appt date/time, parking situation and where to check in, pre-test NPO status and medications ordered, and verified current allergies; name and call back number provided for further questions should they arise  Larey Brick RN Navigator Cardiac Imaging [facility] [phone] office [phone] cell  Patient to take 25mg  metoprolol tartrate two hours prior to cardiac CT and 4mg  zofran 1 hours prior to scan.

## 2020-12-04 NOTE — Telephone Encounter (Signed)
Pt was prescribed Ondansetron 4mg  for her to take before her CT scheduled for Wednesday, pt wants to know if there's another medicine that she was prescribed as well. Please advise pt further

## 2020-12-06 ENCOUNTER — Ambulatory Visit (HOSPITAL_COMMUNITY)
Admission: RE | Admit: 2020-12-06 | Discharge: 2020-12-06 | Disposition: A | Discharge status: Home / Self Care | Payer: Medicare (Managed Care) | Source: Ambulatory Visit | Attending: Cardiology | Admitting: Cardiology

## 2020-12-06 ENCOUNTER — Other Ambulatory Visit: Payer: Self-pay

## 2020-12-06 DIAGNOSIS — I700 Atherosclerosis of aorta: Secondary | ICD-10-CM | POA: Diagnosis not present

## 2020-12-06 DIAGNOSIS — I2510 Atherosclerotic heart disease of native coronary artery without angina pectoris: Secondary | ICD-10-CM | POA: Insufficient documentation

## 2020-12-06 MED ORDER — IOHEXOL 350 MG/ML SOLN
100.0000 mL | Freq: Once | INTRAVENOUS | Status: AC | PRN
  Administered 2020-12-06: 100 mL via INTRAVENOUS

## 2020-12-06 MED ORDER — NITROGLYCERIN 0.4 MG SL SUBL
SUBLINGUAL_TABLET | SUBLINGUAL | Status: AC
  Filled 2020-12-06: qty 2

## 2020-12-06 MED ORDER — NITROGLYCERIN 0.4 MG SL SUBL
0.8000 mg | SUBLINGUAL_TABLET | Freq: Once | SUBLINGUAL | Status: AC
  Administered 2020-12-06: 0.8 mg via SUBLINGUAL

## 2021-02-28 ENCOUNTER — Ambulatory Visit (INDEPENDENT_AMBULATORY_CARE_PROVIDER_SITE_OTHER): Payer: Medicare (Managed Care) | Admitting: Cardiology

## 2021-02-28 ENCOUNTER — Other Ambulatory Visit: Payer: Self-pay

## 2021-02-28 ENCOUNTER — Encounter: Payer: Self-pay | Admitting: Cardiology

## 2021-02-28 VITALS — BP 102/62 | HR 71 | Ht 63.0 in | Wt 169.8 lb

## 2021-02-28 DIAGNOSIS — I2510 Atherosclerotic heart disease of native coronary artery without angina pectoris: Secondary | ICD-10-CM | POA: Diagnosis not present

## 2021-02-28 DIAGNOSIS — G4739 Other sleep apnea: Secondary | ICD-10-CM | POA: Diagnosis not present

## 2021-02-28 DIAGNOSIS — I351 Nonrheumatic aortic (valve) insufficiency: Secondary | ICD-10-CM | POA: Diagnosis not present

## 2021-02-28 DIAGNOSIS — I10 Essential (primary) hypertension: Secondary | ICD-10-CM | POA: Diagnosis not present

## 2021-02-28 NOTE — Patient Instructions (Signed)

## 2021-02-28 NOTE — Progress Notes (Signed)
Cardiology Office Note:    Date:  03/02/2021   ID:  Deanna Allison, DOB 05/29/1950, MRN [id number]  PCP:  Jethro Bastos, MD  Cardiologist:  Thomasene Ripple, DO  Electrophysiologist:  None   Referring MD: Angelica Chessman, MD   " I am doing well"  History of Present Illness:    Deanna Allison is a 70 y.o. female with a hx of  pulmonary sarcoidosis, von willebrand disease, fibromyalgia, diverticulitis, and steroid-induced diabetes, mild coronary artery disease with calcification in LAD and hypertension.  At her last visit she was experiencing chest discomfort therefore I recommended that the patient undergo a coronary cta. We had to prep the patient given her contrast allergy - she succesfully completed the test with no complications. Her results were called out to her earlier. She offers no complaints today. She is here with her husband.   Past Medical History:  Diagnosis Date   Cataracts, bilateral    Diabetes mellitus without complication (HCC)    Diverticulitis    Fatty liver    Fibromyalgia    Glaucoma    Hearing loss of both ears    Heart valve disorder    trace AR, mild PR, mild to mod MR/TR, EF 60% 09/22/12 echo ([facility])   Hypertension    See [facility]   Kidney stone    OSA (obstructive sleep apnea)    uses CPAP sleep study > 3 years ago   PONV (postoperative nausea and vomiting)    Pulmonary sarcoidosis (HCC)    Von Willebrand disease     Past Surgical History:  Procedure Laterality Date   ABDOMINAL HYSTERECTOMY     APPENDECTOMY     BACK SURGERY     CHOLECYSTECTOMY     HAND SURGERY     HERNIA REPAIR     LUNG BIOPSY     MULTIPLE EXTRACTIONS WITH ALVEOLOPLASTY N/A 03/18/2013   Procedure: MULTIPLE SURGICAL TEETH EXTRACTIONS;  Surgeon: Hinton Dyer, DDS;  Location: [facility] OR;  Service: Oral Surgery;  Laterality: N/A;   TIBIA FRACTURE SURGERY      Current Medications: Current Meds  Medication Sig   acetaminophen  (TYLENOL) 500 MG tablet Take 500-1,000 mg by mouth every 6 (six) hours as needed for mild pain.   brimonidine (ALPHAGAN P) 0.1 % SOLN Place 1 drop into the right eye 2 times daily.   Cholecalciferol 50 MCG (2000 UT) TABS Take 50 mcg by mouth daily.   donepezil (ARICEPT) 10 MG tablet Take 10 mg by mouth at bedtime.   dorzolamide (TRUSOPT) 2 % ophthalmic solution Place 1 drop into both eyes 2 (two) times daily.   dorzolamide-timolol (COSOPT) 22.3-6.8 MG/ML ophthalmic solution Place 1 drop into both eyes 2 (two) times daily.   famotidine (PEPCID) 40 MG tablet Take 40 mg by mouth at bedtime.   FLUoxetine (PROZAC) 20 MG capsule Take 20 mg by mouth daily.   furosemide (LASIX) 20 MG tablet Take 1.5 tablets by mouth daily.   hydroxypropyl methylcellulose / hypromellose (ISOPTO TEARS / GONIOVISC) 2.5 % ophthalmic solution Place 1-2 drops into both eyes 3 (three) times daily as needed for dry eyes.   memantine (NAMENDA) 10 MG tablet Take 10 mg by mouth in the morning and at bedtime.   metoprolol tartrate (LOPRESSOR) 25 MG tablet Take 2 hours before CT scan.   Multiple Vitamin (MULTI-VITAMIN) tablet Take 1 tablet by mouth daily.   mycophenolate (CELLCEPT) 250 MG capsule Take 1 capsule by mouth in  the morning and at bedtime.   omeprazole (PRILOSEC) 40 MG capsule Take 40 mg by mouth daily.   ondansetron (ZOFRAN) 4 MG tablet Take 1 tablet (4 mg total) by mouth every 8 (eight) hours.   prednisoLONE acetate (PRED FORTE) 1 % ophthalmic suspension Place 1 drop into the right eye in the morning, at noon, in the evening, and at bedtime.   traMADol (ULTRAM) 50 MG tablet Take 50 mg by mouth every 6 (six) hours as needed for pain.   zolpidem (AMBIEN) 5 MG tablet Take 5 mg by mouth at bedtime as needed for sleep.     Allergies:   Benadryl [diphenhydramine hcl (sleep)], Brimonidine, Cymbalta [duloxetine hcl], Duloxetine, Gabapentin, Iodine, Lyrica [pregabalin], Nsaids, Penicillins, Sulfamethoxazole, Iodinated diagnostic  agents, and Nickel   Social History   Socioeconomic History   Marital status: Married    Spouse name: Not on file   Number of children: Not on file   Years of education: Not on file   Highest education level: Not on file  Occupational History   Not on file  Tobacco Use   Smoking status: Former    Packs/day: 0.50    Years: 16.00    Pack years: 8.00    Types: Cigarettes    Quit date: 04/08/1976    Years since quitting: 44.9   Smokeless tobacco: Never  Substance and Sexual Activity   Alcohol use: No   Drug use: No   Sexual activity: Not on file  Other Topics Concern   Not on file  Social History Narrative   Not on file   Social Determinants of Health   Financial Resource Strain: Not on file  Food Insecurity: Not on file  Transportation Needs: Not on file  Physical Activity: Not on file  Stress: Not on file  Social Connections: Not on file     Family History: The patient's family history includes Brain cancer in her mother; Diabetes in her mother; Heart disease in her mother; Hodgkin's lymphoma in her brother; Hypertension in her mother; Kidney cancer in her sister; Pancreatic cancer in her father; Raynaud syndrome in her daughter.  ROS:   Review of Systems  Constitution: Negative for decreased appetite, fever and weight gain.  HENT: Negative for congestion, ear discharge, hoarse voice and sore throat.   Eyes: Negative for discharge, redness, vision loss in right eye and visual halos.  Cardiovascular: Negative for chest pain, dyspnea on exertion, leg swelling, orthopnea and palpitations.  Respiratory: Negative for cough, hemoptysis, shortness of breath and snoring.   Endocrine: Negative for heat intolerance and polyphagia.  Hematologic/Lymphatic: Negative for bleeding problem. Does not bruise/bleed easily.  Skin: Negative for flushing, nail changes, rash and suspicious lesions.  Musculoskeletal: Negative for arthritis, joint pain, muscle cramps, myalgias, neck pain and  stiffness.  Gastrointestinal: Negative for abdominal pain, bowel incontinence, diarrhea and excessive appetite.  Genitourinary: Negative for decreased libido, genital sores and incomplete emptying.  Neurological: Negative for brief paralysis, focal weakness, headaches and loss of balance.  Psychiatric/Behavioral: Negative for altered mental status, depression and suicidal ideas.  Allergic/Immunologic: Negative for HIV exposure and persistent infections.    EKGs/Labs/Other Studies Reviewed:    The following studies were reviewed today:   EKG:.  None today  CCTA 12/06/2020 Aorta:  Normal size.  Aortic atherosclerosis.  No dissection.   Aortic Valve:  Trileaflet.  No calcifications.   Coronary Arteries:  Normal coronary origin.  Right dominance.   RCA is a large dominant tortuous artery that gives rise  to PDA and PLA. There is no plaque.   Left main is a large artery that gives rise to LAD and LCX arteries.   LAD is a large vessel that has proximal calcified plaque, 0-24%. There are 3 moderate sized tortuous diagonal branches.   LCX is a non-dominant artery that gives rise to one large OM1 branch. There is no plaque.   CALCIUM SCORE: CALCIUM SCORE LM: 0   LAD: 158   LCX: 0   RCA: 0   Total: 158   Percentile: 81%   Other findings:   Normal pulmonary vein drainage into the left atrium.   Normal left atrial appendage without a thrombus.   Moderately dilated pulmonary artery (40 mm) consistent with pulmonary hypertension.   Please see radiology report for non cardiac findings.   IMPRESSION: 1. Coronary calcium score of 158. This was 33 percentile for age and sex matched control.   2. Normal coronary origin with right dominance.   3.  Mild proximal LAD calcified stenosis, 0-24%.   4. Moderately dilated pulmonary artery (40 mm) consistent with pulmonary hypertension.   5. Aortic atherosclerosis.     Electronically Signed   By: Donato Schultz M.D.   On:  12/06/2020 14:41    TTE 12/02/2016 Study Conclusions   - Left ventricle: The cavity size was normal. There was moderate    concentric hypertrophy. Systolic function was vigorous. The    estimated ejection fraction was in the range of 65% to 70%. Wall    motion was normal; there were no regional wall motion    abnormalities. Doppler parameters are consistent with abnormal    left ventricular relaxation (grade 1 diastolic dysfunction).    Doppler parameters are consistent with elevated ventricular    end-diastolic filling pressure.  - Aortic valve: Trileaflet; normal thickness leaflets. There was no    regurgitation.  - Aortic root: The aortic root was normal in size.  - Mitral valve: There was mild regurgitation.  - Left atrium: The atrium was moderately dilated.  - Right ventricle: Systolic function was normal.  - Tricuspid valve: There was moderate regurgitation.  - Pulmonic valve: There was mild regurgitation.  - Pulmonary arteries: Systolic pressure was moderately increased.    PA peak pressure: 44 mm Hg (S).  - Inferior vena cava: The vessel was normal in size.  - Pericardium, extracardiac: A trivial pericardial effusion was    identified posterior to the heart.   -------------------------------------------------------------------  Study data:  No prior study was available for comparison.  Study  status:  Routine.  Procedure:  The patient reported no pain pre or  post test. Transthoracic echocardiography. Image quality was poor.  The study was technically difficult, as a result of restricted  patient mobility and breast implants.  Study completion:  There  were no complications.          Transthoracic echocardiography.  M-mode, complete 2D, spectral Doppler, and color Doppler.  Birthdate:  Patient birthdate: 07-14-1950.  Age:  Patient is 70 yr  old.  Sex:  Gender: female.    BMI: 35.4 kg/m^2.  Blood pressure:    140/80  Patient status:  Inpatient.  Study date:  Study date:   12/02/2016. Study time: 09:49 AM.  Location:  Bedside.   -------------------------------------------------------------------   -------------------------------------------------------------------  Left ventricle:  The cavity size was normal. There was moderate  concentric hypertrophy. Systolic function was vigorous. The  estimated ejection fraction was in the range of 65% to 70%. Wall  motion  was normal; there were no regional wall motion  abnormalities. Doppler parameters are consistent with abnormal left  ventricular relaxation (grade 1 diastolic dysfunction). Doppler  parameters are consistent with elevated ventricular end-diastolic  filling pressure.   -------------------------------------------------------------------  Aortic valve:   Trileaflet; normal thickness leaflets. Mobility was  not restricted.  Doppler:  Transvalvular velocity was within the  normal range. There was no stenosis. There was no regurgitation.     -------------------------------------------------------------------  Aorta:  Aortic root: The aortic root was normal in size.   -------------------------------------------------------------------  Mitral valve:   Structurally normal valve.   Mobility was not  restricted.  Doppler:  Transvalvular velocity was within the normal  range. There was no evidence for stenosis. There was mild  regurgitation.    Peak gradient (D): 4 mm Hg.   -------------------------------------------------------------------  Left atrium:  The atrium was moderately dilated.   -------------------------------------------------------------------  Right ventricle:  The cavity size was normal. Wall thickness was  normal. Systolic function was normal.   -------------------------------------------------------------------  Pulmonic valve:    Structurally normal valve.   Cusp separation was  normal.  Doppler:  Transvalvular velocity was within the normal  range. There was no evidence for  stenosis. There was mild  regurgitation.   -------------------------------------------------------------------  Tricuspid valve:   Structurally normal valve.    Doppler:  Transvalvular velocity was within the normal range. There was  moderate regurgitation.   -------------------------------------------------------------------  Pulmonary artery:   The main pulmonary artery was normal-sized.  Systolic pressure was moderately increased.   -------------------------------------------------------------------  Right atrium:  The atrium was normal in size.   -------------------------------------------------------------------  Pericardium:  A trivial pericardial effusion was identified  posterior to the heart.   -------------------------------------------------------------------  Systemic veins:  Inferior vena cava: The vessel was normal in size.   Recent Labs: No results found for requested labs within last 8760 hours.  Recent Lipid Panel No results found for: CHOL, TRIG, HDL, CHOLHDL, VLDL, LDLCALC, LDLDIRECT  Physical Exam:    VS:  BP 102/62 (BP Location: Left Arm)   Pulse 71   Ht 5\' 3"  (1.6 m)   Wt 169 lb 12.8 oz (77 kg)   SpO2 96%   BMI 30.08 kg/m     Wt Readings from Last 3 Encounters:  02/28/21 169 lb 12.8 oz (77 kg)  11/23/20 169 lb 1.3 oz (76.7 kg)  09/25/17 199 lb (90.3 kg)     GEN: Well nourished, well developed in no acute distress HEENT: Normal NECK: No JVD; No carotid bruits LYMPHATICS: No lymphadenopathy CARDIAC: S1S2 noted,RRR, no murmurs, rubs, gallops RESPIRATORY:  Clear to auscultation without rales, wheezing or rhonchi  ABDOMEN: Soft, non-tender, non-distended, +bowel sounds, no guarding. EXTREMITIES: No edema, No cyanosis, no clubbing MUSCULOSKELETAL:  No deformity  SKIN: Warm and dry NEUROLOGIC:  Alert and oriented x 3, non-focal PSYCHIATRIC:  Normal affect, good insight  ASSESSMENT:    1. Mild CAD   2. Essential hypertension   3. Other sleep  apnea   4. Moderate aortic regurgitation    PLAN:     She discuss her testing finding with mild cad and no need for any further interventions. Will continue with medical management.   Blood pressure is acceptable, continue with current antihypertensive regimen.  With her moderate aortic regurgitation she appears to be euvolemic.  No signs of fluid overload.  Plan to repeat her echocardiogram in 1 year.  Continue with her CPAP   The patient is in agreement with the above plan. The patient  left the office in stable condition.  The patient will follow up in 1 year or sooner if needed.   Medication Adjustments/Labs and Tests Ordered: Current medicines are reviewed at length with the patient today.  Concerns regarding medicines are outlined above.  No orders of the defined types were placed in this encounter.  No orders of the defined types were placed in this encounter.   Patient Instructions  Medication Instructions:  Your physician recommends that you continue on your current medications as directed. Please refer to the Current Medication list given to you today.  *If you need a refill on your cardiac medications before your next appointment, please call your pharmacy*   Lab Work: None If you have labs (blood work) drawn today and your tests are completely normal, you will receive your results only by: MyChart Message (if you have MyChart) OR A paper copy in the mail If you have any lab test that is abnormal or we need to change your treatment, we will call you to review the results.   Testing/Procedures: None   Follow-Up: At [facility], you and your health needs are our priority.  As part of our continuing mission to provide you with exceptional heart care, we have created designated Provider Care Teams.  These Care Teams include your primary Cardiologist (physician) and Advanced Practice Providers (APPs -  Physician Assistants and Nurse Practitioners) who all work  together to provide you with the care you need, when you need it.  We recommend signing up for the patient portal called "MyChart".  Sign up information is provided on this After Visit Summary.  MyChart is used to connect with patients for Virtual Visits (Telemedicine).  Patients are able to view lab/test results, encounter notes, upcoming appointments, etc.  Non-urgent messages can be sent to your provider as well.   To learn more about what you can do with MyChart, go to [email or web address].    Your next appointment:   1 year(s)  The format for your next appointment:   In Person  Provider:   Thomasene Ripple, DO     Other Instructions     Adopting a Healthy Lifestyle.  Know what a healthy weight is for you (roughly BMI <25) and aim to maintain this   Aim for 7+ servings of fruits and vegetables daily   65-80+ fluid ounces of water or unsweet tea for healthy kidneys   Limit to max 1 drink of alcohol per day; avoid smoking/tobacco   Limit animal fats in diet for cholesterol and heart health - choose grass fed whenever available   Avoid highly processed foods, and foods high in saturated/trans fats   Aim for low stress - take time to unwind and care for your mental health   Aim for 150 min of moderate intensity exercise weekly for heart health, and weights twice weekly for bone health   Aim for 7-9 hours of sleep daily   When it comes to diets, agreement about the perfect plan isnt easy to find, even among the experts. Experts at the [facility] [employer] developed an idea known as the Healthy Eating Plate. Just imagine a plate divided into logical, healthy portions.   The emphasis is on diet quality:   Load up on vegetables and fruits - one-half of your plate: Aim for color and variety, and remember that potatoes dont count.   Go for whole grains - one-quarter of your plate: Whole wheat, barley, wheat berries, quinoa, oats, brown rice,  and foods made with them.  If you want pasta, go with whole wheat pasta.   Protein power - one-quarter of your plate: Fish, chicken, beans, and nuts are all healthy, versatile protein sources. Limit red meat.   The diet, however, does go beyond the plate, offering a few other suggestions.   Use healthy plant oils, such as olive, canola, soy, corn, sunflower and peanut. Check the labels, and avoid partially hydrogenated oil, which have unhealthy trans fats.   If youre thirsty, drink water. Coffee and tea are good in moderation, but skip sugary drinks and limit milk and dairy products to one or two daily servings.   The type of carbohydrate in the diet is more important than the amount. Some sources of carbohydrates, such as vegetables, fruits, whole grains, and beans-are healthier than others.   Finally, stay active  Signed, Thomasene Ripple, DO  03/02/2021 9:31 AM    [facility]

## 2021-04-16 ENCOUNTER — Telehealth: Payer: Self-pay | Admitting: Cardiology

## 2021-04-16 NOTE — Telephone Encounter (Signed)
*  STAT* If patient is at the pharmacy, call can be transferred to refill team.   1. Which medications need to be refilled? (please list name of each medication and dose if known)  rosuvastatin (CRESTOR) 10 MG tablet    2. Which pharmacy/location (including street and city if local pharmacy) is medication to be sent to? [facility] - [address], [place], [place] [address] [phone])   3. Do they need a 30 day or 90 day supply?   90 day supply

## 2021-04-17 ENCOUNTER — Other Ambulatory Visit: Payer: Self-pay

## 2021-04-17 MED ORDER — ROSUVASTATIN CALCIUM 10 MG PO TABS: 10.0000 mg | ORAL_TABLET | Freq: Every day | ORAL | 3 refills | Status: DC

## 2021-05-15 ENCOUNTER — Telehealth: Payer: Self-pay | Admitting: Cardiology

## 2021-05-15 NOTE — Telephone Encounter (Signed)
Spoke with pt, she was recently seen by her pulmonologist and he told her to ask dr tobb about taking the lasix, that confused the patient because the pulmonologist is the doctor that started her on the medication. She does not take the lasix on a daily basis and did not take any at all last week due to incontinence while sleeping. She has not taken any this week either. She denies SOB, more than her usual or edema. Encouraged the patient to start weighing herself every morning as that is a good indicator of extra fluid buildup. Aware will forward to dr tobb for directions regarding lasix.

## 2021-05-15 NOTE — Telephone Encounter (Signed)
Pt c/o swelling: STAT is pt has developed SOB within 24 hours  If swelling, where is the swelling located? No swelling  How much weight have you gained and in what time span? N/A  Have you gained 3 pounds in a day or 5 pounds in a week? N/A  Do you have a log of your daily weights (if so, list)? No   Are you currently taking a fluid pill? Yes  Are you currently SOB? No. Was SOB last week  Have you traveled recently? No  Wants to know if she should continue to talk Lasix. Patient was told to call Dr. Harriet Masson by Pulmonologist Dr. Camillo Flaming for fluid around heart. Dr. Camillo Flaming prescribed the patient Lasix 20 MG tablet

## 2021-05-18 NOTE — Telephone Encounter (Signed)
I did speak with the patient about the use of her Lasix.  She had a few questions.  We will continue the patient taking her Lasix at the current dose daily.

## 2021-05-22 ENCOUNTER — Telehealth: Payer: Self-pay | Admitting: Cardiology

## 2021-05-22 MED ORDER — ROSUVASTATIN CALCIUM 10 MG PO TABS: 10.0000 mg | ORAL_TABLET | Freq: Every day | ORAL | 10 refills | Status: AC

## 2021-05-22 NOTE — Telephone Encounter (Signed)
°*  STAT* If patient is at the pharmacy, call can be transferred to refill team.   1. Which medications need to be refilled? (please list name of each medication and dose if known) need a new prescription for Rosuvastatin  2. Which pharmacy/location (including street and city if local pharmacy) is medication to be sent to? [facility] [address], [place]  3. Do they need a 30 day or 90 day supply? 30 days and refills
